# Patient Record
Sex: Female | Born: 1940 | Race: White | Hispanic: No | Marital: Single | Smoking: Never smoker
Health system: Southern US, Community
[De-identification: ages and names within clinical notes are randomized; demographics above are authoritative.]

## PROBLEM LIST (undated history)

## (undated) DIAGNOSIS — K760 Fatty (change of) liver, not elsewhere classified: Secondary | ICD-10-CM

## (undated) DIAGNOSIS — H269 Unspecified cataract: Secondary | ICD-10-CM

## (undated) DIAGNOSIS — E538 Deficiency of other specified B group vitamins: Secondary | ICD-10-CM

## (undated) DIAGNOSIS — I10 Essential (primary) hypertension: Secondary | ICD-10-CM

## (undated) DIAGNOSIS — M1990 Unspecified osteoarthritis, unspecified site: Secondary | ICD-10-CM

## (undated) DIAGNOSIS — K648 Other hemorrhoids: Secondary | ICD-10-CM

## (undated) DIAGNOSIS — K219 Gastro-esophageal reflux disease without esophagitis: Secondary | ICD-10-CM

## (undated) DIAGNOSIS — F1910 Other psychoactive substance abuse, uncomplicated: Secondary | ICD-10-CM

## (undated) DIAGNOSIS — R000 Tachycardia, unspecified: Secondary | ICD-10-CM

## (undated) DIAGNOSIS — D509 Iron deficiency anemia, unspecified: Secondary | ICD-10-CM

## (undated) DIAGNOSIS — R1310 Dysphagia, unspecified: Secondary | ICD-10-CM

## (undated) DIAGNOSIS — K5790 Diverticulosis of intestine, part unspecified, without perforation or abscess without bleeding: Secondary | ICD-10-CM

## (undated) DIAGNOSIS — K635 Polyp of colon: Secondary | ICD-10-CM

## (undated) DIAGNOSIS — E785 Hyperlipidemia, unspecified: Secondary | ICD-10-CM

## (undated) HISTORY — DX: Iron deficiency anemia, unspecified: D50.9

## (undated) HISTORY — DX: Polyp of colon: K63.5

## (undated) HISTORY — DX: Unspecified cataract: H26.9

## (undated) HISTORY — PX: HIATAL HERNIA REPAIR: SHX195

## (undated) HISTORY — DX: Other psychoactive substance abuse, uncomplicated: F19.10

## (undated) HISTORY — DX: Unspecified osteoarthritis, unspecified site: M19.90

## (undated) HISTORY — PX: TONSILLECTOMY AND ADENOIDECTOMY: SUR1326

## (undated) HISTORY — DX: Other hemorrhoids: K64.8

## (undated) HISTORY — DX: Diverticulosis of intestine, part unspecified, without perforation or abscess without bleeding: K57.90

## (undated) HISTORY — DX: Dysphagia, unspecified: R13.10

## (undated) HISTORY — DX: Tachycardia, unspecified: R00.0

## (undated) HISTORY — DX: Fatty (change of) liver, not elsewhere classified: K76.0

## (undated) HISTORY — PX: COLONOSCOPY: SHX174

## (undated) HISTORY — DX: Deficiency of other specified B group vitamins: E53.8

## (undated) HISTORY — DX: Hyperlipidemia, unspecified: E78.5

## (undated) HISTORY — DX: Gastro-esophageal reflux disease without esophagitis: K21.9

## (undated) HISTORY — PX: DILATION AND CURETTAGE OF UTERUS: SHX78

---

## 1999-12-25 ENCOUNTER — Encounter (INDEPENDENT_AMBULATORY_CARE_PROVIDER_SITE_OTHER): Payer: Self-pay | Admitting: Specialist

## 1999-12-25 ENCOUNTER — Other Ambulatory Visit: Admission: RE | Admit: 1999-12-25 | Discharge: 1999-12-25 | Payer: Self-pay | Admitting: Internal Medicine

## 2000-05-24 ENCOUNTER — Emergency Department (HOSPITAL_COMMUNITY): Admission: EM | Admit: 2000-05-24 | Discharge: 2000-05-24 | Payer: Self-pay | Admitting: Emergency Medicine

## 2004-11-06 ENCOUNTER — Ambulatory Visit: Payer: Self-pay | Admitting: Internal Medicine

## 2006-02-23 ENCOUNTER — Ambulatory Visit: Payer: Self-pay | Admitting: Internal Medicine

## 2006-03-09 ENCOUNTER — Encounter (INDEPENDENT_AMBULATORY_CARE_PROVIDER_SITE_OTHER): Payer: Self-pay | Admitting: Specialist

## 2006-03-09 ENCOUNTER — Ambulatory Visit: Payer: Self-pay | Admitting: Internal Medicine

## 2006-04-20 ENCOUNTER — Ambulatory Visit: Payer: Self-pay | Admitting: Internal Medicine

## 2006-05-28 ENCOUNTER — Ambulatory Visit: Payer: Self-pay | Admitting: Internal Medicine

## 2006-06-09 ENCOUNTER — Ambulatory Visit: Payer: Self-pay | Admitting: Internal Medicine

## 2006-06-09 LAB — CONVERTED CEMR LAB
Basophils Absolute: 0.1 10*3/uL (ref 0.0–0.1)
HCT: 31.7 % — ABNORMAL LOW (ref 36.0–46.0)
MCHC: 31.7 g/dL (ref 30.0–36.0)
MCV: 83.9 fL (ref 78.0–100.0)
Neutro Abs: 2.7 10*3/uL (ref 1.4–7.7)
Neutrophils Relative %: 51.9 % (ref 43.0–77.0)
Platelets: 353 10*3/uL (ref 150–400)
RBC: 3.78 M/uL — ABNORMAL LOW (ref 3.87–5.11)

## 2007-04-26 ENCOUNTER — Ambulatory Visit: Payer: Self-pay | Admitting: Internal Medicine

## 2007-04-26 LAB — CONVERTED CEMR LAB
ALT: 20 units/L (ref 0–35)
AST: 22 units/L (ref 0–37)
Basophils Relative: 1.2 % — ABNORMAL HIGH (ref 0.0–1.0)
Bilirubin, Direct: 0.1 mg/dL (ref 0.0–0.3)
CO2: 20 meq/L (ref 19–32)
Calcium: 9.7 mg/dL (ref 8.4–10.5)
Chloride: 108 meq/L (ref 96–112)
Creatinine, Ser: 0.7 mg/dL (ref 0.4–1.2)
Eosinophils Relative: 4.1 % (ref 0.0–5.0)
GFR calc Af Amer: 108 mL/min
Glucose, Bld: 89 mg/dL (ref 70–99)
Lymphocytes Relative: 33 % (ref 12.0–46.0)
Neutro Abs: 2.5 10*3/uL (ref 1.4–7.7)
Platelets: 235 10*3/uL (ref 150–400)
RDW: 12.8 % (ref 11.5–14.6)
Total Bilirubin: 0.6 mg/dL (ref 0.3–1.2)
Total Protein: 7.5 g/dL (ref 6.0–8.3)
WBC: 5.1 10*3/uL (ref 4.5–10.5)

## 2007-05-06 ENCOUNTER — Ambulatory Visit: Payer: Self-pay | Admitting: Internal Medicine

## 2007-05-06 LAB — CONVERTED CEMR LAB
Fecal Occult Blood: NEGATIVE
OCCULT 1: POSITIVE — AB
OCCULT 2: POSITIVE — AB
OCCULT 3: NEGATIVE
OCCULT 4: NEGATIVE
OCCULT 5: NEGATIVE

## 2007-08-15 ENCOUNTER — Ambulatory Visit: Payer: Self-pay | Admitting: Internal Medicine

## 2007-08-15 DIAGNOSIS — K5730 Diverticulosis of large intestine without perforation or abscess without bleeding: Secondary | ICD-10-CM

## 2007-08-15 DIAGNOSIS — K5190 Ulcerative colitis, unspecified, without complications: Secondary | ICD-10-CM | POA: Insufficient documentation

## 2007-08-15 DIAGNOSIS — IMO0002 Reserved for concepts with insufficient information to code with codable children: Secondary | ICD-10-CM

## 2007-08-15 DIAGNOSIS — K219 Gastro-esophageal reflux disease without esophagitis: Secondary | ICD-10-CM | POA: Insufficient documentation

## 2007-08-15 DIAGNOSIS — K648 Other hemorrhoids: Secondary | ICD-10-CM | POA: Insufficient documentation

## 2007-08-15 HISTORY — DX: Gastro-esophageal reflux disease without esophagitis: K21.9

## 2007-08-15 HISTORY — DX: Diverticulosis of large intestine without perforation or abscess without bleeding: K57.30

## 2007-08-15 HISTORY — DX: Reserved for concepts with insufficient information to code with codable children: IMO0002

## 2007-08-15 HISTORY — DX: Ulcerative colitis, unspecified, without complications: K51.90

## 2007-08-15 LAB — CONVERTED CEMR LAB
Bilirubin, Direct: 0.2 mg/dL (ref 0.0–0.3)
Total Protein: 7 g/dL (ref 6.0–8.3)

## 2007-08-17 ENCOUNTER — Ambulatory Visit: Payer: Self-pay | Admitting: Internal Medicine

## 2007-08-19 ENCOUNTER — Encounter: Payer: Self-pay | Admitting: Internal Medicine

## 2007-08-25 DIAGNOSIS — D509 Iron deficiency anemia, unspecified: Secondary | ICD-10-CM

## 2008-05-01 ENCOUNTER — Encounter: Payer: Self-pay | Admitting: Internal Medicine

## 2008-05-11 ENCOUNTER — Ambulatory Visit: Payer: Self-pay | Admitting: Internal Medicine

## 2008-05-11 DIAGNOSIS — K7689 Other specified diseases of liver: Secondary | ICD-10-CM

## 2008-05-11 HISTORY — DX: Other specified diseases of liver: K76.89

## 2008-05-11 LAB — CONVERTED CEMR LAB
ALT: 19 units/L (ref 0–35)
AST: 18 units/L (ref 0–37)
Bilirubin, Direct: 0.1 mg/dL (ref 0.0–0.3)
Eosinophils Relative: 2.8 % (ref 0.0–5.0)
HCT: 37.8 % (ref 36.0–46.0)
Hemoglobin: 12.8 g/dL (ref 12.0–15.0)
Lymphocytes Relative: 24.1 % (ref 12.0–46.0)
Monocytes Absolute: 0.6 10*3/uL (ref 0.1–1.0)
Monocytes Relative: 12 % (ref 3.0–12.0)
Neutro Abs: 3.2 10*3/uL (ref 1.4–7.7)
Total Bilirubin: 0.7 mg/dL (ref 0.3–1.2)
Total Protein: 7.6 g/dL (ref 6.0–8.3)
WBC: 5.1 10*3/uL (ref 4.5–10.5)

## 2009-02-08 ENCOUNTER — Encounter (INDEPENDENT_AMBULATORY_CARE_PROVIDER_SITE_OTHER): Payer: Self-pay | Admitting: *Deleted

## 2009-03-20 ENCOUNTER — Ambulatory Visit: Payer: Self-pay | Admitting: Internal Medicine

## 2009-04-04 ENCOUNTER — Ambulatory Visit: Payer: Self-pay | Admitting: Internal Medicine

## 2009-04-04 ENCOUNTER — Encounter: Payer: Self-pay | Admitting: Internal Medicine

## 2009-04-05 ENCOUNTER — Encounter: Payer: Self-pay | Admitting: Internal Medicine

## 2009-04-12 ENCOUNTER — Ambulatory Visit: Payer: Self-pay | Admitting: Internal Medicine

## 2009-04-12 LAB — CONVERTED CEMR LAB
Basophils Absolute: 0 10*3/uL (ref 0.0–0.1)
Basophils Relative: 0 % (ref 0.0–3.0)
Eosinophils Absolute: 0.3 10*3/uL (ref 0.0–0.7)
Lymphocytes Relative: 28.1 % (ref 12.0–46.0)
MCHC: 33.8 g/dL (ref 30.0–36.0)
MCV: 102.8 fL — ABNORMAL HIGH (ref 78.0–100.0)
Monocytes Absolute: 0.4 10*3/uL (ref 0.1–1.0)
Neutrophils Relative %: 57.8 % (ref 43.0–77.0)
Platelets: 210 10*3/uL (ref 150.0–400.0)
RBC: 3.66 M/uL — ABNORMAL LOW (ref 3.87–5.11)

## 2009-04-15 ENCOUNTER — Ambulatory Visit: Payer: Self-pay | Admitting: Internal Medicine

## 2009-04-16 LAB — CONVERTED CEMR LAB
Folate: 17.1 ng/mL
Vitamin B-12: 296 pg/mL (ref 211–911)

## 2009-04-18 ENCOUNTER — Ambulatory Visit: Payer: Self-pay | Admitting: Internal Medicine

## 2009-04-24 ENCOUNTER — Ambulatory Visit: Payer: Self-pay | Admitting: Internal Medicine

## 2009-04-24 DIAGNOSIS — E538 Deficiency of other specified B group vitamins: Secondary | ICD-10-CM | POA: Insufficient documentation

## 2009-05-01 ENCOUNTER — Ambulatory Visit: Payer: Self-pay | Admitting: Internal Medicine

## 2009-05-08 ENCOUNTER — Ambulatory Visit: Payer: Self-pay | Admitting: Internal Medicine

## 2009-06-07 ENCOUNTER — Ambulatory Visit: Payer: Self-pay | Admitting: Internal Medicine

## 2009-07-12 ENCOUNTER — Ambulatory Visit: Payer: Self-pay | Admitting: Internal Medicine

## 2009-07-15 LAB — CONVERTED CEMR LAB
Eosinophils Relative: 5.1 % — ABNORMAL HIGH (ref 0.0–5.0)
Lymphocytes Relative: 25.7 % (ref 12.0–46.0)
Monocytes Relative: 9.3 % (ref 3.0–12.0)
Neutrophils Relative %: 57.4 % (ref 43.0–77.0)
Platelets: 393 10*3/uL (ref 150.0–400.0)
RDW: 14.6 % (ref 11.5–14.6)
WBC: 4.3 10*3/uL — ABNORMAL LOW (ref 4.5–10.5)

## 2009-08-15 ENCOUNTER — Ambulatory Visit: Payer: Self-pay | Admitting: Internal Medicine

## 2009-09-16 ENCOUNTER — Ambulatory Visit: Payer: Self-pay | Admitting: Internal Medicine

## 2009-10-14 ENCOUNTER — Ambulatory Visit: Payer: Self-pay | Admitting: Internal Medicine

## 2009-10-14 LAB — CONVERTED CEMR LAB
Basophils Absolute: 0.1 10*3/uL (ref 0.0–0.1)
Basophils Relative: 1.4 % (ref 0.0–3.0)
Eosinophils Absolute: 0.2 10*3/uL (ref 0.0–0.7)
Folate: 15.2 ng/mL
Lymphocytes Relative: 26.8 % (ref 12.0–46.0)
MCHC: 33.3 g/dL (ref 30.0–36.0)
MCV: 100 fL (ref 78.0–100.0)
Monocytes Absolute: 0.6 10*3/uL (ref 0.1–1.0)
Neutrophils Relative %: 56 % (ref 43.0–77.0)
Platelets: 183 10*3/uL (ref 150.0–400.0)
RBC: 3.48 M/uL — ABNORMAL LOW (ref 3.87–5.11)
RDW: 15.1 % — ABNORMAL HIGH (ref 11.5–14.6)

## 2009-11-13 ENCOUNTER — Ambulatory Visit: Payer: Self-pay | Admitting: Internal Medicine

## 2009-12-11 ENCOUNTER — Ambulatory Visit: Payer: Self-pay | Admitting: Internal Medicine

## 2010-01-10 ENCOUNTER — Ambulatory Visit: Payer: Self-pay | Admitting: Internal Medicine

## 2010-01-10 LAB — CONVERTED CEMR LAB
Basophils Absolute: 0 10*3/uL
Basophils Relative: 0.8 %
Eosinophils Absolute: 0.2 10*3/uL
Eosinophils Relative: 3.3 %
HCT: 36.9 %
Hemoglobin: 12.4 g/dL
Lymphocytes Relative: 26.3 %
Lymphs Abs: 1.4 10*3/uL
MCHC: 33.5 g/dL
MCV: 101.1 fL — ABNORMAL HIGH
Monocytes Absolute: 0.6 10*3/uL
Monocytes Relative: 11.1 %
Neutro Abs: 3.1 10*3/uL
Neutrophils Relative %: 58.5 %
Platelets: 204 10*3/uL
RBC: 3.65 M/uL — ABNORMAL LOW
RDW: 14.2 %
WBC: 5.4 10*3/uL

## 2010-02-10 ENCOUNTER — Ambulatory Visit: Payer: Self-pay | Admitting: Internal Medicine

## 2010-02-25 ENCOUNTER — Ambulatory Visit: Payer: Self-pay | Admitting: Internal Medicine

## 2010-03-14 ENCOUNTER — Ambulatory Visit: Payer: Self-pay | Admitting: Internal Medicine

## 2010-04-14 ENCOUNTER — Ambulatory Visit: Payer: Self-pay | Admitting: Internal Medicine

## 2010-05-12 ENCOUNTER — Ambulatory Visit: Payer: Self-pay | Admitting: Internal Medicine

## 2010-06-09 ENCOUNTER — Ambulatory Visit: Payer: Self-pay | Admitting: Internal Medicine

## 2010-07-10 ENCOUNTER — Ambulatory Visit
Admission: RE | Admit: 2010-07-10 | Discharge: 2010-07-10 | Payer: Self-pay | Source: Home / Self Care | Attending: Internal Medicine | Admitting: Internal Medicine

## 2010-07-22 NOTE — Assessment & Plan Note (Signed)
Summary: Monthly B12, 266.2  Nurse Visit   Allergies: 1)  ! Codeine  Medication Administration  Injection # 1:    Medication: Vit B12 1000 mcg    Diagnosis: B12 DEFICIENCY (ICD-266.2)    Route: IM    Site: L deltoid    Exp Date: 01/2012    Lot #: [id number]    Mfr: [place]    Comments: pt scheduled the next monthly b 12 at the front desk    Patient tolerated injection without complications    Given by: Christian Mate CMA Deborra Medina) (May 12, 2010 2:38 PM)  Orders Added: 1)  Vit B12 1000 mcg [Y3016]

## 2010-07-22 NOTE — Assessment & Plan Note (Signed)
Summary: RENEW AZULFIDINE RX...AS.    History of Present Illness Visit Type: Follow-up Visit Primary GI MD: Delfin Edis MD Primary Provider: Gilford Rile, MD Requesting Provider: na Chief Complaint: F/u for ulcerative colitis. Pt states that she feels great and denies any GI complaints. Pt needs Azulfidine refilled  History of Present Illness:   This is a 70 year old white female with ulcerative colitis since 27. She used to have flare ups almost yearly but has always responded to steroids. She started on 6-MP in 2007. She has not had any flareups for several years. Her last colonoscopy in October 2010 showed a hyperplastic sigmoid polyp but there was otherwise no evidence of acute colitis. Her last hemoglobin completed last in July 2011 was 12.6. She is on B12 supplements 1,000 mcg monthly. Her initial B12 level was 295. Her most recent  B12 level was normal. She is here today to refill her medications.   GI Review of Systems      Denies abdominal pain, acid reflux, belching, bloating, chest pain, dysphagia with liquids, dysphagia with solids, heartburn, loss of appetite, nausea, vomiting, vomiting blood, weight loss, and  weight gain.        Denies anal fissure, black tarry stools, change in bowel habit, constipation, diarrhea, diverticulosis, fecal incontinence, heme positive stool, hemorrhoids, irritable bowel syndrome, jaundice, light color stool, liver problems, rectal bleeding, and  rectal pain.    Current Medications (verified): 1)  Mercaptopurine 50 Mg Tabs (Mercaptopurine) .... Take 1/2 Tablet  (52m) By Mouth Once A Day 2)  Azulfidine 500 Mg Tabs (Sulfasalazine) .... Take 2 Tablets By Mouth Two Times A Day 3)  Folic Acid 1 Mg Tabs (Folic Acid) .... Take 1 Tablet By Mouth Once A Day 4)  Slow Release Iron 160 (50 Fe) Mg Cr-Tabs (Ferrous Sulfate Dried) .... Take 1 Tablet By Mouth 2-3 Times Per Week 5)  Reclasp Infusion .... Take Once Yearly 6)  Prevacid 24hr 15 Mg Cpdr  (Lansoprazole) .... As Needed 7)  Simvastatin 20 Mg Tabs (Simvastatin) .... One Tablet By Mouth Once Daily  Allergies (verified): 1)  ! Codeine  Past History:  Past Medical History: Reviewed history from 05/10/2008 and no changes required. Current Problems:  IRON DEFICIENCY (ICD-280.9) AGITATION (ICD-307.9) DIVERTICULOSIS OF COLON (ICD-562.10) INTERNAL HEMORRHOIDS (ICD-455.0) GERD (ICD-530.81) ULCERATIVE COLITIS (ICD-556.9)  Past Surgical History: Reviewed history from 05/11/2008 and no changes required. T & A  D&C  Family History: Reviewed history from 06/07/2009 and no changes required. Lymphoma: Brother Family History of Diabetes: Mother Family History of Heart Disease: Father Family History of Liver Disease/Cirrhosis:Niece No FH of Colon Cancer:  Social History: Reviewed history from 06/07/2009 and no changes required. Occupation: Retired Patient has never smoked.  Alcohol Use - yes -occ Illicit Drug Use - no Single no children  Review of Systems  The patient denies allergy/sinus, anemia, anxiety-new, arthritis/joint pain, back pain, blood in urine, breast changes/lumps, change in vision, confusion, cough, coughing up blood, depression-new, fainting, fatigue, fever, headaches-new, hearing problems, heart murmur, heart rhythm changes, itching, menstrual pain, muscle pains/cramps, night sweats, nosebleeds, pregnancy symptoms, shortness of breath, skin rash, sleeping problems, sore throat, swelling of feet/legs, swollen lymph glands, thirst - excessive , urination - excessive , urination changes/pain, urine leakage, vision changes, and voice change.         Pertinent positive and negative review of systems were noted in the above HPI. All other ROS was otherwise negative.   Vital Signs:  Patient profile:   70year old female  Height:      66 inches Weight:      188 pounds BMI:     30.45 BSA:     1.95 Pulse rate:   88 / minute Pulse rhythm:   regular BP sitting:    132 / 80  (left arm) Cuff size:   regular  Vitals Entered By: Hope Pigeon CMA (February 25, 2010 10:27 AM)   Impression & Recommendations:  Problem # 1:  B12 DEFICIENCY (ICD-266.2) Patient is on B12 1000 ug monthly which she gets in our office. We will recheck her B12 level in 6 months.  Problem # 2:  IRON DEFICIENCY (ICD-280.9) Patient's last hemoglobin was normal.  Problem # 3:  ULCERATIVE COLITIS (ICD-556.9) Patient has had Ulcerative Colitis since 1981. We will refill her sulfasalazine 1 g twice a day, folic acid 1 mg p.o. q.d. and 6-MP 25 mg daily.  Patient Instructions: 1)  CBC, B12 every 6 months. Next labs will be March 2012. 2)  Refill folic acid 6-MP and azulfadine. 3)  Recall colonoscopy October 2013. 4)  Office visit in 1 year. 5)  Copy sent to : Dr Bea Graff 6)  The medication list was reviewed and reconciled.  All changed / newly prescribed medications were explained.  A complete medication list was provided to the patient / caregiver. Prescriptions: FOLIC ACID 1 MG TABS (FOLIC ACID) Take 1 tablet by mouth once a day  #90 x 5   Entered by:   Madlyn Frankel CMA (AAMA)   Authorized by:   Lafayette Dragon MD   Signed by:   Madlyn Frankel CMA ([place]) on 02/25/2010   Method used:   Electronically to        [facility]  [address] (retail)       [address], [place]  [address]       Ph: [phone] or [phone]       Fax: [phone]   RxID:   [id number] MERCAPTOPURINE 50 MG TABS (MERCAPTOPURINE) Take as directed  #30 x 6   Entered by:   Madlyn Frankel CMA ([place])   Authorized by:   Lafayette Dragon MD   Signed by:   Madlyn Frankel CMA ([place]) on 02/25/2010   Method used:   Electronically to        [facility]  [address] (retail)       [address], [place]  [address]       Ph: [phone] or [phone]       Fax: [phone]   RxID:   [id number] AZULFIDINE 500 MG TABS  (SULFASALAZINE) Take 2 tablets by mouth two times a day  #120 x 11   Entered by:   Madlyn Frankel CMA (AAMA)   Authorized by:   Lafayette Dragon MD   Signed by:   Madlyn Frankel CMA ([place]) on 02/25/2010   Method used:   Electronically to        [facility]  [address] (retail)       [address], [place]  [address]       Ph: [phone] or [phone]       Fax: [phone]   RxID:   [id number]

## 2010-07-22 NOTE — Assessment & Plan Note (Signed)
Summary: MONTHLY B12 SHOT...LSW.  Nurse Visit   Allergies: 1)  ! Codeine  Medication Administration  Injection # 1:    Medication: Vit B12 1000 mcg    Diagnosis: B12 DEFICIENCY (ICD-266.2)    Route: IM    Site: L deltoid    Exp Date: 11/2011    Lot #: [id number]    Mfr: [employer]    Comments: Monthly B12 injection    Patient tolerated injection without complications    Given by: Hope Pigeon CMA (February 10, 2010 10:22 AM)  Orders Added: 1)  Vit B12 1000 mcg [[id number]]

## 2010-07-22 NOTE — Assessment & Plan Note (Signed)
Summary: B12 SHOT..AM.  Nurse Visit   Allergies: 1)  ! Codeine  Medication Administration  Injection # 1:    Medication: Vit B12 1000 mcg    Diagnosis: B12 DEFICIENCY (ICD-266.2)    Route: IM    Site: R deltoid    Exp Date: 01/2011    Lot #: [id number]    Mfr: [employer]    Comments: pt to schedule next monthly b12 at front desk    Patient tolerated injection without complications    Given by: Christian Mate CMA ([place]) (August 15, 2009 10:10 AM)  Orders Added: 1)  Vit B12 1000 mcg [[id number]]

## 2010-07-22 NOTE — Assessment & Plan Note (Signed)
Summary: MONTHLY B12 SHOT...LSW.  Nurse Visit   Allergies: 1)  ! Codeine  Medication Administration  Injection # 1:    Medication: Vit B12 1000 mcg    Diagnosis: B12 DEFICIENCY (ICD-266.2)    Route: IM    Site: R deltoid    Exp Date: 12/21/2011    Lot #: [id number]    Mfr: [employer]    Comments: Made next monthly B12 injection appt on 05-12-10 at 3:00 PM .    Patient tolerated injection without complications    Given by: Marisue Humble NCMA (April 14, 2010 10:53 AM)  Orders Added: 1)  Vit B12 1000 mcg [[id number]]

## 2010-07-22 NOTE — Assessment & Plan Note (Signed)
Summary: b12 shot  Nurse Visit   Allergies: 1)  ! Codeine  Medication Administration  Injection # 1:    Medication: Vit B12 1000 mcg    Diagnosis: B12 DEFICIENCY (ICD-266.2)    Route: IM    Site: L deltoid    Exp Date: 09/21/2011    Lot #: [id number]    Mfr: [employer]    Comments: Next Monthly B12 appt made for 12-11-09 at 11AM.    Patient tolerated injection without complications    Given by: Marisue Humble NCMA (Nov 13, 2009 2:51 PM)

## 2010-07-22 NOTE — Assessment & Plan Note (Signed)
Summary: MONTHLY B12 SHOT...LSW.  Nurse Visit   Allergies: 1)  ! Codeine  Medication Administration  Injection # 1:    Medication: Vit B12 1000 mcg    Diagnosis: B12 DEFICIENCY (ICD-266.2)    Route: IM    Site: L deltoid    Exp Date: 10/2011    Lot #: [id number]    Mfr: [place]    Patient tolerated injection without complications    Given by: Madlyn Frankel CMA ([place]) (January 10, 2010 10:38 AM)  Orders Added: 1)  Vit B12 1000 mcg [[id number]]   Medication Administration  Injection # 1:    Medication: Vit B12 1000 mcg    Diagnosis: B12 DEFICIENCY (ICD-266.2)    Route: IM    Site: L deltoid    Exp Date: 10/2011    Lot #: [id number]    Mfr: [place]    Patient tolerated injection without complications    Given by: Madlyn Frankel CMA ([place]) (January 10, 2010 10:38 AM)  Orders Added: 1)  Vit B12 1000 mcg [[id number]]

## 2010-07-22 NOTE — Assessment & Plan Note (Signed)
Summary: MONTHLY B12 SHOT..AM.  Nurse Visit   Allergies: 1)  ! Codeine  Medication Administration  Injection # 1:    Medication: Vit B12 1000 mcg    Diagnosis: B12 DEFICIENCY (ICD-266.2)    Route: IM    Site: L deltoid    Exp Date: 12/2011    Lot #: [id number]    Mfr: [place]    Patient tolerated injection without complications    Given by: Bernita Buffy CMA ([place]) (March 14, 2010 1:23 PM)  Orders Added: 1)  Vit B12 1000 mcg [[id number]]

## 2010-07-22 NOTE — Assessment & Plan Note (Signed)
Summary: monthly b12/dn  Nurse Visit   Allergies: 1)  ! Codeine  Medication Administration  Injection # 1:    Medication: Vit B12 1000 mcg    Diagnosis: B12 DEFICIENCY (ICD-266.2)    Route: IM    Site: L deltoid    Exp Date: 10/12    Lot #: [id number]    Mfr: [employer]    Patient tolerated injection without complications    Given by: Awilda Bill CMA Deborra Medina) (July 12, 2009 10:03 AM)  Orders Added: 1)  Vit B12 1000 mcg [[id number]]

## 2010-07-22 NOTE — Assessment & Plan Note (Signed)
Summary: MONTHLY B12 SHOT...LSW.  Nurse Visit   Allergies: 1)  ! Codeine  Medication Administration  Injection # 1:    Medication: Vit B12 1000 mcg    Diagnosis: B12 DEFICIENCY (ICD-266.2)    Route: IM    Site: L deltoid    Exp Date: 07/24/2011    Lot #: [id number]    Mfr: [employer]    Patient tolerated injection without complications    Given by: Marlon Pel CMA ([place]) (October 14, 2009 10:30 AM)  Orders Added: 1)  Vit B12 1000 mcg [[id number]]

## 2010-07-22 NOTE — Assessment & Plan Note (Signed)
Summary: B12 Injection, 266.2, monthly  Nurse Visit   Allergies: 1)  ! Codeine  Medication Administration  Injection # 1:    Medication: Vit B12 1000 mcg    Diagnosis: B12 DEFICIENCY (ICD-266.2)    Route: IM    Site: L deltoid    Exp Date: 07/24/2011    Lot #: [id number]    Mfr: [employer]    Comments: Monthly injection to be scheduled with labwork, same day    Patient tolerated injection without complications    Given by: June McMurray [place] Deborra Medina) (December 11, 2009 10:47 AM)   Medication Administration  Injection # 1:    Medication: Vit B12 1000 mcg    Diagnosis: B12 DEFICIENCY (ICD-266.2)    Route: IM    Site: L deltoid    Exp Date: 07/24/2011    Lot #: [id number]    Mfr: [employer]    Comments: Monthly injection to be scheduled with labwork, same day    Patient tolerated injection without complications    Given by: June McMurray [place] Deborra Medina) (December 11, 2009 10:47 AM)

## 2010-07-22 NOTE — Assessment & Plan Note (Signed)
Summary: MONTHLY B12 SHOT..AM.  Nurse Visit   Allergies: 1)  ! Codeine  Medication Administration  Injection # 1:    Medication: Vit B12 1000 mcg    Diagnosis: B12 DEFICIENCY (ICD-266.2)    Route: IM    Site: L deltoid    Exp Date: 12/12    Lot #: [id number]    Mfr: [employer]    Patient tolerated injection without complications    Given by: Awilda Bill CMA Deborra Medina) (September 16, 2009 1:02 PM)  Orders Added: 1)  Vit B12 1000 mcg [[id number]]

## 2010-07-24 NOTE — Assessment & Plan Note (Signed)
Summary: MONTHLY B12 SHOT/JMS  Nurse Visit   Allergies: 1)  ! Codeine  Medication Administration  Injection # 1:    Medication: Vit B12 1000 mcg    Diagnosis: B12 DEFICIENCY (ICD-266.2)    Route: IM    Site: L deltoid    Exp Date: 03/22/2012    Lot #: [id number]    Mfr: [employer]    Comments: Monthly B12 Injection     Patient tolerated injection without complications    Given by: Hope Pigeon CMA (June 09, 2010 1:26 PM)  Orders Added: 1)  Vit B12 1000 mcg [[id number]]

## 2010-07-24 NOTE — Assessment & Plan Note (Signed)
Summary: MONTHLY B12  Nurse Visit   Allergies: 1)  ! Codeine  Medication Administration  Injection # 1:    Medication: Vit B12 1000 mcg    Diagnosis: B12 DEFICIENCY (ICD-266.2)    Route: IM    Site: R deltoid    Exp Date: 04/22/2012    Lot #: [id number]    Mfr: [employer]    Comments: Monthly B12 injections Patient inquired about next appointment for [facility], which is 09/08/2010, patient informed    Patient tolerated injection without complications    Given by: June McMurray [place] Deborra Medina) (July 10, 2010 11:02 AM)  Orders Added: 1)  Vit B12 1000 mcg [[id number]]   Medication Administration  Injection # 1:    Medication: Vit B12 1000 mcg    Diagnosis: B12 DEFICIENCY (ICD-266.2)    Route: IM    Site: R deltoid    Exp Date: 04/22/2012    Lot #: [id number]    Mfr: [employer]    Comments: Monthly B12 injections Patient inquired about next appointment for [facility], which is 09/08/2010, patient informed    Patient tolerated injection without complications    Given by: June McMurray [place] Deborra Medina) (July 10, 2010 11:02 AM)  Orders Added: 1)  Vit B12 1000 mcg [[id number]]

## 2010-08-11 ENCOUNTER — Encounter (INDEPENDENT_AMBULATORY_CARE_PROVIDER_SITE_OTHER): Payer: Self-pay | Admitting: *Deleted

## 2010-08-11 ENCOUNTER — Encounter (INDEPENDENT_AMBULATORY_CARE_PROVIDER_SITE_OTHER): Payer: MEDICARE

## 2010-08-11 DIAGNOSIS — E538 Deficiency of other specified B group vitamins: Secondary | ICD-10-CM

## 2010-08-19 NOTE — Assessment & Plan Note (Signed)
Summary: MONTHLY B12 SHOT  Nurse Visit   Allergies: 1)  ! Codeine  Medication Administration  Injection # 3:    Medication: Vit B12 1000 mcg    Diagnosis: B12 DEFICIENCY (ICD-266.2)    Route: IM    Site: L deltoid    Exp Date: 04/22/2012    Lot #: [id number]    Mfr: [employer]    Comments: patient tolerated well- next, 6 month B12 lab     Patient tolerated injection without complications    Given by: Shella Maxim RN (August 11, 2010 1:14 PM)  Orders Added: 1)  Vit B12 1000 mcg [[id number]]

## 2010-09-08 ENCOUNTER — Other Ambulatory Visit: Payer: Self-pay

## 2010-09-11 ENCOUNTER — Ambulatory Visit: Payer: MEDICARE

## 2010-09-11 ENCOUNTER — Ambulatory Visit (INDEPENDENT_AMBULATORY_CARE_PROVIDER_SITE_OTHER): Payer: MEDICARE | Admitting: Internal Medicine

## 2010-09-11 DIAGNOSIS — E538 Deficiency of other specified B group vitamins: Secondary | ICD-10-CM

## 2010-09-11 DIAGNOSIS — K5190 Ulcerative colitis, unspecified, without complications: Secondary | ICD-10-CM

## 2010-09-11 LAB — CBC WITH DIFFERENTIAL/PLATELET
Basophils Relative: 0.7 % (ref 0.0–3.0)
Eosinophils Absolute: 0.2 10*3/uL (ref 0.0–0.7)
Eosinophils Relative: 3.2 % (ref 0.0–5.0)
HCT: 38.1 % (ref 36.0–46.0)
Lymphs Abs: 1.1 10*3/uL (ref 0.7–4.0)
MCHC: 33.4 g/dL (ref 30.0–36.0)
MCV: 100.8 fl — ABNORMAL HIGH (ref 78.0–100.0)
Monocytes Absolute: 0.5 10*3/uL (ref 0.1–1.0)
Neutro Abs: 3 10*3/uL (ref 1.4–7.7)
RBC: 3.79 Mil/uL — ABNORMAL LOW (ref 3.87–5.11)
WBC: 4.8 10*3/uL (ref 4.5–10.5)

## 2010-09-11 MED ORDER — CYANOCOBALAMIN 1000 MCG/ML IJ SOLN
1000.0000 ug | INTRAMUSCULAR | Status: AC
  Administered 2010-09-11 – 2011-01-23 (×5): 1000 ug via INTRAMUSCULAR

## 2010-09-12 ENCOUNTER — Telehealth: Payer: Self-pay | Admitting: *Deleted

## 2010-09-12 NOTE — Telephone Encounter (Signed)
Message copied by Leone Payor on Fri Sep 12, 2010  8:46 AM ------      Message from: Delfin Edis      Created: Thu Sep 11, 2010  5:02 PM       ERROR! The text below belongs to another patient.            ----- Message -----         From: Lafayette Dragon, MD         Sent: 09/11/2010   4:47 PM           To: Hulan Saas, RN            I have discussed results of the CT scan with the pt.he is still nauseated, unable to eat. Please send her Augmentin 8100m, #7, 1 po qd. Also send Lab order to the Lab for tomorrow am labs: U/A, CBC . Thanxwith diff., amylase,lipase, sed.rate

## 2010-09-12 NOTE — Telephone Encounter (Signed)
Left message for patient to call back. Leone Payor, RN Level II

## 2010-09-12 NOTE — Telephone Encounter (Signed)
Patient returned our call and left message. Called patient and left message with results. Leone Payor, RN Level II

## 2010-10-16 ENCOUNTER — Ambulatory Visit (INDEPENDENT_AMBULATORY_CARE_PROVIDER_SITE_OTHER): Payer: MEDICARE | Admitting: Internal Medicine

## 2010-10-16 DIAGNOSIS — E538 Deficiency of other specified B group vitamins: Secondary | ICD-10-CM

## 2010-11-04 NOTE — Assessment & Plan Note (Signed)
[facility]                         GASTROENTEROLOGY OFFICE NOTE   NAME:Caroline Dean, Caroline Dean                    MRN:          [id number]  DATE:08/17/2007                            DOB:          16-Nov-1940    Caroline Dean is a 70 year old white female with chronic ulcerative colitis  since 1981, last colonoscopy in September 2007, with active left-sided  colitis, no evidence of dysplasia.  She was recently changed to 6-  mercaptopurine 50 mg daily and remained asymptomatic until about a month  ago, when she developed occasional right upper quadrant discomfort,  which travels across her abdomen to the left lower quadrant and feels  like gas.  She denies any nausea or vomiting and there is no family  history of gallbladder disease.  She has never had an ultrasound of the  abdomen.  Her liver function tests show mild elevation of alkaline  phosphatase in November 2008 and again on August 15, 2007, showing  normal serum albumin and transaminases.   MEDICATIONS:  1. Azulfidine 500 mg two p.o. b.i.d.  2. Folic acid 1 mg p.o. daily.  3. Simvastatin 20 mg p.o. daily.  4. Vitamin D with calcium daily.  5. Omeprazole 20 mg p.o. daily.  6. Slow release iron 2 times a week.  7. 6-mercaptopurine 50 mg p.o. daily.   PHYSICAL EXAM:  Blood pressure 142/84, pulse 72, weight 190 pounds.  She was alert and oriented, in no distress.  LUNGS:  Clear to auscultation.  COR:  With normal S1, normal S2.  ABDOMEN:  Soft, nontender with normoactive bowel sounds, mildly obese.  Right upper quadrant was normal with liver edge at costal margin.  No  pain on inspiration.  Lower abdomen was normal.  RECTAL EXAM:  With soft Hemoccult-negative stool.   IMPRESSION:  1. Patient is a 70 year old white female with chronic ulcerative      colitis and new onset of right upper quadrant discomfort, which      could be possibly due to colon spasm.  Rule out symptomatic      gallbladder  disease.  Rule out fatty liver.  2. Abnormal alkaline phosphatase.  Rule out fatty liver.  Rule out      gallbladder disease.  Rule out drug-related abnormalities, due to      6MP.   PLAN:  1. Upper abdominal ultrasound with attention to liver, pancreas and      gallbladder.  2. Sample soft probiotic Align one p.o. daily.  3. Bentyl 10 mg up to two a day p.r.n. crampy abdominal pain.   I feel that her symptoms are not suggestive of flare-up of her  ulcerative colitis, but she will let us know if the symptoms  deteriorate, for instance if she develops diarrhea or rectal bleeding.     Lowella Bandy. [name], MD  Electronically Signed    DMB/MedQ  DD: 08/17/2007  DT: 08/17/2007  Job #: [id number]   cc:   Gilford Rile, MD

## 2010-11-04 NOTE — Assessment & Plan Note (Signed)
[facility] OFFICE NOTE   NAME:Caroline Dean, Caroline Dean                    MRN:          [id number]  DATE:04/26/2007                            DOB:          1940-07-25    The patient is a 70 year old white female with ulcerative colitis since  1981, periodic colonoscopy last one in September of 2007 which did not  show any evidence of dysplasia or any activity.  She has iron deficiency  anemia.  Since her last flare-up recently she has been on 6-  mercaptopuren 25 mg daily and has been asymptomatic.  She has been on  chronic iron supplements because of iron deficiency anemia.  Her last  hemoglobin in last December was 7.5 and hematocrit 31.7.   MEDICATIONS:  1. Folic acid 1 mg p.o. daily.  2. Azulfidine 1 gram p.o. b.i.d.  3. Omeprazole 20 mg p.o. daily.  4. Slow release iron two to three times a week.  5. Lipitor 20 mg p.o. daily.   PHYSICAL EXAMINATION:  VITAL SIGNS:  Blood pressure 138/88, pulse 80,  and weight 193 pounds.  GENERAL:  She is alert and oriented and in no distress.  LUNGS:  Clear to auscultation.  HEART:  Normal S1 and S2.  ABDOMEN:  Soft and nontender with normal active bowel sounds.  RECTAL:  Black hemoccult positive stool.   IMPRESSION:  A 70 year old white female with ulcerative colitis of at  least 25 years' duration, normal colonoscopy in September of 2007.  She  is not experiencing symptoms of ulcerative colitis, but she is heme  positive.  This could be related either to low activity of her disease  or possibly just some anorectal source.  It would not be likely from  upper GI tract, although, she does take an occasional omeprazole for  gastroesophageal reflux disease.   PLAN:  1. Refill on all of her medications.  2. CBC and iron levels today, sed rate, and CMET as well as CEA level.  3. Repeat hemoccults today.  If positive, I would probably consider      repeating the  colonoscopy which has normally been scheduled for      September 2010.     Lowella Bandy. [name], MD  Electronically Signed   DMB/MedQ  DD: 04/26/2007  DT: 04/26/2007  Job #: [id number]   cc:   Gilford Rile, MD

## 2010-11-07 NOTE — Assessment & Plan Note (Signed)
[facility] OFFICE NOTE   NAME:Caroline Dean, Caroline Dean                    MRN:          [id number]  DATE:05/28/2006                            DOB:          [birth year]    Caroline Dean is a delightful 70 year old white female with chronic  ulcerative colitis since 31.  She had a colonoscopy in September 2007  which showed no evidence of dysplasia.  In recent years she has had  recent flare-ups of her colitis, and so we put her on 6-mercaptopurine  on her last visit 8 weeks ago.  Unfortunately, she developed nausea  after being on the medication for several weeks, which went away after  stopping the medication 3 days ago.  Her ulcerative colitis has been in  remission.  She was able to taper off her prednisone to 2.5 mg daily,  and finally discontinued it about 2 weeks ago.  She is having regular  bowel habits once a day, no bleeding, no diarrhea or abdominal pain.   MEDICATIONS:  Other medications include:  1. Azulfidine 1 gram twice a day.  2. Folic acid 1 mg once a day.  3. Vitamin D and calcium.  4. She is also on OTC Prilosec 200 mg daily.   PHYSICAL EXAMINATION:  Blood pressure 140/70, pulse 68, weight 194  pounds.  She was alert, oriented, in no distress.  LUNGS:  Clear to auscultation.  COR:  With normal S1, normal S2.  ABDOMEN:  Soft, nontender with normal active bowel sounds.  RECTAL:  Exam not repeated today.   IMPRESSION:  1. 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  2. INTOLERANCE TO 6-MERCAPTOPURINE, had to be discontinued.   PLAN:  1. Decrease 6-MP to 25 mg a day.  I would like her to continue it in      the lower dose because she has been having frequent flare-ups, and      the 6-MP may prevent it and avoid use of steroids, which she has      used over the years and had resulted in osteoporosis.  2. Continue Azulfidine as such.  3. CBC with diff today.  4. Office visit in 6 months.     Lowella Bandy. [name], MD  Electronically Signed   DMB/MedQ  DD: 05/28/2006  DT: 05/29/2006  Job #: [id number]   cc:   Gilford Rile, MD

## 2010-11-07 NOTE — Assessment & Plan Note (Signed)
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  She just had a followup colonoscopy on  March 09, 2006, with finding of active colitis in the segment of  descending colon between 30 and 70 cm.  This was confirmed on biopsies which  showed active chronic mucosal colitis consistent with ulcerative colitis  without evidence of dysplasia.  She also has hemorrhoids and mild  diverticulosis of the left colon.  As a result of the biopsies and  endoscopic findings, she was put on a tapering dose of prednisone with  marked improvement of her bowel habits.  She has been able to decrease her  dose to 5 mg a day starting today and planning to be on 5 mg for two weeks,  then 2.5 mg for two weeks, then discontinue.  I have discussed her flare-ups  of ulcerative colitis with Ms. Caroline Dean.  She has had flare-up about every  year necessitating use of steroids.  Because of her high risk for  osteoporosis and many use of steroids, I suggested that we start her on  immunomodulator 6-mercaptopurine 50 mg a day to prevent flare-ups of her  colitis.  She would still continue her Azulfidine 2 g a day with folic acid  1 mg a day.  I have explained to her side effects and possible risks of  using 6-mercaptopurine, but I think the benefits would at this time exceed  the risks.   PLAN:  Add 6-mercaptopurine 50 mg daily while tapering off of her  prednisone.  I will see her again in 6 weeks.  At that time we will check  her CBC.     Lowella Bandy. [name], MD    DMB/MedQ  DD: 04/20/2006  DT: 04/20/2006  Job #: [id number]   cc:   Gilford Rile, MD

## 2010-11-18 ENCOUNTER — Ambulatory Visit (INDEPENDENT_AMBULATORY_CARE_PROVIDER_SITE_OTHER): Payer: Medicare Other | Admitting: Internal Medicine

## 2010-11-18 DIAGNOSIS — E538 Deficiency of other specified B group vitamins: Secondary | ICD-10-CM

## 2010-12-22 ENCOUNTER — Ambulatory Visit (INDEPENDENT_AMBULATORY_CARE_PROVIDER_SITE_OTHER): Payer: Medicare Other | Admitting: Internal Medicine

## 2010-12-22 DIAGNOSIS — E538 Deficiency of other specified B group vitamins: Secondary | ICD-10-CM

## 2011-01-23 ENCOUNTER — Ambulatory Visit (INDEPENDENT_AMBULATORY_CARE_PROVIDER_SITE_OTHER): Payer: Medicare Other | Admitting: Gastroenterology

## 2011-01-23 DIAGNOSIS — E538 Deficiency of other specified B group vitamins: Secondary | ICD-10-CM

## 2011-02-27 ENCOUNTER — Ambulatory Visit (INDEPENDENT_AMBULATORY_CARE_PROVIDER_SITE_OTHER): Payer: Medicare Other | Admitting: Internal Medicine

## 2011-02-27 DIAGNOSIS — E538 Deficiency of other specified B group vitamins: Secondary | ICD-10-CM

## 2011-03-20 ENCOUNTER — Other Ambulatory Visit: Payer: Self-pay | Admitting: Internal Medicine

## 2011-03-31 ENCOUNTER — Ambulatory Visit (INDEPENDENT_AMBULATORY_CARE_PROVIDER_SITE_OTHER): Payer: Medicare Other | Admitting: Internal Medicine

## 2011-03-31 DIAGNOSIS — E538 Deficiency of other specified B group vitamins: Secondary | ICD-10-CM

## 2011-03-31 MED ORDER — CYANOCOBALAMIN 1000 MCG/ML IJ SOLN
1000.0000 ug | Freq: Once | INTRAMUSCULAR | Status: AC
  Administered 2011-03-31: 1000 ug via INTRAMUSCULAR

## 2011-04-01 ENCOUNTER — Other Ambulatory Visit: Payer: Self-pay | Admitting: Internal Medicine

## 2011-05-01 ENCOUNTER — Ambulatory Visit (INDEPENDENT_AMBULATORY_CARE_PROVIDER_SITE_OTHER): Payer: Medicare Other | Admitting: Internal Medicine

## 2011-05-01 DIAGNOSIS — E538 Deficiency of other specified B group vitamins: Secondary | ICD-10-CM

## 2011-05-01 MED ORDER — CYANOCOBALAMIN 1000 MCG/ML IJ SOLN
1000.0000 ug | INTRAMUSCULAR | Status: DC
  Administered 2011-05-01 – 2012-02-09 (×10): 1000 ug via INTRAMUSCULAR

## 2011-05-08 ENCOUNTER — Other Ambulatory Visit: Payer: Self-pay | Admitting: Internal Medicine

## 2011-05-11 ENCOUNTER — Other Ambulatory Visit: Payer: Self-pay | Admitting: Internal Medicine

## 2011-05-11 MED ORDER — SULFASALAZINE 500 MG PO TABS: ORAL_TABLET | ORAL | Status: DC

## 2011-05-11 NOTE — Telephone Encounter (Signed)
rx sent

## 2011-05-29 ENCOUNTER — Encounter: Payer: Self-pay | Admitting: *Deleted

## 2011-06-03 ENCOUNTER — Other Ambulatory Visit (INDEPENDENT_AMBULATORY_CARE_PROVIDER_SITE_OTHER): Payer: Medicare Other

## 2011-06-03 ENCOUNTER — Ambulatory Visit (INDEPENDENT_AMBULATORY_CARE_PROVIDER_SITE_OTHER): Payer: Medicare Other | Admitting: Internal Medicine

## 2011-06-03 ENCOUNTER — Telehealth: Payer: Self-pay | Admitting: *Deleted

## 2011-06-03 ENCOUNTER — Encounter: Payer: Self-pay | Admitting: Internal Medicine

## 2011-06-03 VITALS — BP 118/64 | HR 80 | Ht 67.0 in | Wt 184.0 lb

## 2011-06-03 DIAGNOSIS — K5100 Ulcerative (chronic) pancolitis without complications: Secondary | ICD-10-CM

## 2011-06-03 DIAGNOSIS — Z79899 Other long term (current) drug therapy: Secondary | ICD-10-CM

## 2011-06-03 LAB — COMPREHENSIVE METABOLIC PANEL
AST: 18 U/L (ref 0–37)
Alkaline Phosphatase: 67 U/L (ref 39–117)
BUN: 19 mg/dL (ref 6–23)
Creatinine, Ser: 0.9 mg/dL (ref 0.4–1.2)

## 2011-06-03 LAB — CBC WITH DIFFERENTIAL/PLATELET
Eosinophils Relative: 2.9 % (ref 0.0–5.0)
Lymphs Abs: 1.3 10*3/uL (ref 0.7–4.0)
MCHC: 33.7 g/dL (ref 30.0–36.0)
Monocytes Absolute: 0.6 10*3/uL (ref 0.1–1.0)
Monocytes Relative: 11.4 % (ref 3.0–12.0)
Neutro Abs: 3.2 10*3/uL (ref 1.4–7.7)
RBC: 3.72 Mil/uL — ABNORMAL LOW (ref 3.87–5.11)

## 2011-06-03 LAB — IBC PANEL: Saturation Ratios: 21.5 % (ref 20.0–50.0)

## 2011-06-03 MED ORDER — OMEPRAZOLE 20 MG PO CPDR: 20.0000 mg | DELAYED_RELEASE_CAPSULE | Freq: Every day | ORAL | Status: DC

## 2011-06-03 MED ORDER — SULFASALAZINE 500 MG PO TABS: ORAL_TABLET | ORAL | Status: DC

## 2011-06-03 NOTE — Telephone Encounter (Signed)
Spoke with patient and gave her lab results as per Dr. [name]

## 2011-06-03 NOTE — Telephone Encounter (Signed)
Left a message for patient to call me. 

## 2011-06-03 NOTE — Progress Notes (Signed)
Caroline Dean 16-Oct-1940 MRN [id number]   History of Present Illness:  This is a 70 year old white female with ulcerative colitis since 45 which is currently under good control.  Her last flareup was at least 2 years ago. She has been maintained on 6-MP 25 mg a day and Azulfidine 1 g twice a day with folic acid 1 mg daily. She also has a history of B12 deficiency for which she takes B12 1,000 mcg injections monthly. She denies diarrhea, abdominal pain, rectal bleeding. Her last colonoscopy was in October 2010 and showed a hyperplastic polyp. A recall colonoscopy is planned for October 2013.    Past Medical History  Diagnosis Date  . Hyperplastic colon polyp   . Vitamin B12 deficiency   . Diverticulosis   . Internal hemorrhoids   . GERD (gastroesophageal reflux disease)   . Ulcerative colitis   . Fatty liver   . Hyperlipemia    Past Surgical History  Procedure Date  . Tonsillectomy and adenoidectomy   . Dilation and curettage of uterus     reports that she has never smoked. She has never used smokeless tobacco. She reports that she drinks alcohol. She reports that she does not use illicit drugs. family history includes Cirrhosis in her other; Diabetes in her mother; Heart disease in her father; and Lymphoma in her brother.  There is no history of Colon cancer. Allergies  Allergen Reactions  . Codeine     REACTION: nausea/vomiting        Review of Systems: Denies heartburn, dysphagia or shortness of breath or chest pain  The remainder of the 10 point ROS is negative except as outlined in H&P   Physical Exam: General appearance  Well developed, in no distress. Eyes- non icteric. HEENT nontraumatic, normocephalic. Mouth no lesions, tongue papillated, no cheilosis. Neck supple without adenopathy, thyroid not enlarged, no carotid bruits, no JVD. Lungs Clear to auscultation bilaterally. Cor normal S1, normal S2, regular rhythm, no murmur,  quiet precordium. Abdomen:  Mildly protuberant abdomen, soft, nontender. Hyperactive bowel sounds. Liver edge at costal margin. Rectal: Soft Hemoccult negative stool. Extremities no pedal edema. Skin no lesions. Neurological alert and oriented x 3. Psychological normal mood and affect.  Assessment and Plan:  Problem #1 Ulcerative colitis in remission. This was diagnosed 30 years ago. She will continue on 6-MP 25 mg daily and we will reduce her Azulfidine to 1 g a day with folic acid 1 mg daily. We will obtain labs today. She is due to have a B12 shot as well today. We will see her in one year. A recall colonoscopy will be completed in October 2013.   06/03/2011 Delfin Edis

## 2011-06-03 NOTE — Patient Instructions (Addendum)
Your physician has requested that you go to the basement for the following lab work before leaving today: CBC, B12, IBC, CMET We have sent the following medications to your pharmacy for you to pick up at your convenience: Azulfadine  Omeprazole CC: Dr Bea Graff

## 2011-06-03 NOTE — Telephone Encounter (Signed)
Message copied by Hulan Saas on Wed Jun 03, 2011  2:08 PM ------      Message from: Lafayette Dragon      Created: Wed Jun 03, 2011  1:35 PM       Please call pt, all labs normal

## 2011-06-07 ENCOUNTER — Other Ambulatory Visit: Payer: Self-pay | Admitting: Internal Medicine

## 2011-07-08 ENCOUNTER — Ambulatory Visit (INDEPENDENT_AMBULATORY_CARE_PROVIDER_SITE_OTHER): Payer: MEDICARE | Admitting: Internal Medicine

## 2011-07-08 DIAGNOSIS — E538 Deficiency of other specified B group vitamins: Secondary | ICD-10-CM

## 2011-08-10 ENCOUNTER — Ambulatory Visit (INDEPENDENT_AMBULATORY_CARE_PROVIDER_SITE_OTHER): Payer: Medicare Other | Admitting: Internal Medicine

## 2011-08-10 DIAGNOSIS — E538 Deficiency of other specified B group vitamins: Secondary | ICD-10-CM

## 2011-09-11 ENCOUNTER — Ambulatory Visit (INDEPENDENT_AMBULATORY_CARE_PROVIDER_SITE_OTHER): Payer: Medicare Other | Admitting: Internal Medicine

## 2011-09-11 DIAGNOSIS — E538 Deficiency of other specified B group vitamins: Secondary | ICD-10-CM

## 2011-10-09 ENCOUNTER — Ambulatory Visit (INDEPENDENT_AMBULATORY_CARE_PROVIDER_SITE_OTHER): Payer: Medicare Other | Admitting: Internal Medicine

## 2011-10-09 DIAGNOSIS — E538 Deficiency of other specified B group vitamins: Secondary | ICD-10-CM

## 2011-11-09 ENCOUNTER — Ambulatory Visit (INDEPENDENT_AMBULATORY_CARE_PROVIDER_SITE_OTHER): Payer: Medicare Other | Admitting: Internal Medicine

## 2011-11-09 DIAGNOSIS — E538 Deficiency of other specified B group vitamins: Secondary | ICD-10-CM

## 2011-12-10 ENCOUNTER — Ambulatory Visit (INDEPENDENT_AMBULATORY_CARE_PROVIDER_SITE_OTHER): Payer: Medicare Other | Admitting: Internal Medicine

## 2011-12-10 DIAGNOSIS — E538 Deficiency of other specified B group vitamins: Secondary | ICD-10-CM

## 2012-01-08 ENCOUNTER — Ambulatory Visit (INDEPENDENT_AMBULATORY_CARE_PROVIDER_SITE_OTHER): Payer: Medicare Other | Admitting: Internal Medicine

## 2012-01-08 DIAGNOSIS — E538 Deficiency of other specified B group vitamins: Secondary | ICD-10-CM

## 2012-02-09 ENCOUNTER — Ambulatory Visit (INDEPENDENT_AMBULATORY_CARE_PROVIDER_SITE_OTHER): Payer: Medicare Other | Admitting: Internal Medicine

## 2012-02-09 DIAGNOSIS — E538 Deficiency of other specified B group vitamins: Secondary | ICD-10-CM

## 2012-02-25 ENCOUNTER — Encounter: Payer: Self-pay | Admitting: Internal Medicine

## 2012-03-11 ENCOUNTER — Ambulatory Visit (INDEPENDENT_AMBULATORY_CARE_PROVIDER_SITE_OTHER): Payer: Medicare Other | Admitting: Internal Medicine

## 2012-03-11 ENCOUNTER — Encounter: Payer: Self-pay | Admitting: Internal Medicine

## 2012-03-11 DIAGNOSIS — E538 Deficiency of other specified B group vitamins: Secondary | ICD-10-CM

## 2012-03-11 MED ORDER — CYANOCOBALAMIN 1000 MCG/ML IJ SOLN
1000.0000 ug | Freq: Once | INTRAMUSCULAR | Status: AC
  Administered 2012-03-11: 1000 ug via INTRAMUSCULAR

## 2012-03-15 ENCOUNTER — Telehealth: Payer: Self-pay | Admitting: Internal Medicine

## 2012-03-15 NOTE — Telephone Encounter (Signed)
It could be beginning of colitis flare up. Please send Canasa supp 1050m,#30, 1 qhs

## 2012-03-15 NOTE — Telephone Encounter (Signed)
Last OV 06/03/11 for yearly f/u. Hx of UC since 1981 and maintained on 6MP 25 mg/day and Azulfidine 1 G daily; she also has B12 deficiency which she has monthly B12 injections here. Last COLON 03/2009 with hyperplastic polyp and she is scheduled for Direct COLON on 05/13/12.  Pt reports bloody mucus 1st thing in the am for several weeks on and off and daily for the last few days. She is beginning to ache, but he stools are normal. Please advise.thanks.

## 2012-03-16 MED ORDER — MESALAMINE 1000 MG RE SUPP: RECTAL | Status: DC

## 2012-03-16 NOTE — Telephone Encounter (Signed)
lmom for pt to call back

## 2012-03-16 NOTE — Telephone Encounter (Signed)
Informed pt of Dr Nichola Sizer order for [facility]; she stated understanding and will call back for questions.

## 2012-04-11 ENCOUNTER — Ambulatory Visit (INDEPENDENT_AMBULATORY_CARE_PROVIDER_SITE_OTHER): Payer: Medicare Other | Admitting: Internal Medicine

## 2012-04-11 DIAGNOSIS — E538 Deficiency of other specified B group vitamins: Secondary | ICD-10-CM

## 2012-04-11 MED ORDER — CYANOCOBALAMIN 1000 MCG/ML IJ SOLN: 1000.0000 ug | Freq: Once | INTRAMUSCULAR | Status: DC

## 2012-04-29 ENCOUNTER — Encounter: Payer: Self-pay | Admitting: Internal Medicine

## 2012-04-29 ENCOUNTER — Ambulatory Visit (AMBULATORY_SURGERY_CENTER): Payer: Medicare Other

## 2012-04-29 VITALS — Ht 67.0 in | Wt 191.0 lb

## 2012-04-29 DIAGNOSIS — Z1211 Encounter for screening for malignant neoplasm of colon: Secondary | ICD-10-CM

## 2012-05-13 ENCOUNTER — Ambulatory Visit (AMBULATORY_SURGERY_CENTER): Payer: Medicare Other | Admitting: Internal Medicine

## 2012-05-13 ENCOUNTER — Encounter: Payer: Self-pay | Admitting: Internal Medicine

## 2012-05-13 ENCOUNTER — Other Ambulatory Visit: Payer: Self-pay | Admitting: *Deleted

## 2012-05-13 VITALS — BP 138/77 | HR 64 | Temp 97.5°F | Resp 16 | Ht 67.0 in | Wt 181.0 lb

## 2012-05-13 DIAGNOSIS — K5150 Left sided colitis without complications: Secondary | ICD-10-CM

## 2012-05-13 DIAGNOSIS — Z8719 Personal history of other diseases of the digestive system: Secondary | ICD-10-CM

## 2012-05-13 DIAGNOSIS — Z1211 Encounter for screening for malignant neoplasm of colon: Secondary | ICD-10-CM

## 2012-05-13 DIAGNOSIS — D126 Benign neoplasm of colon, unspecified: Secondary | ICD-10-CM

## 2012-05-13 DIAGNOSIS — K5289 Other specified noninfective gastroenteritis and colitis: Secondary | ICD-10-CM

## 2012-05-13 DIAGNOSIS — K648 Other hemorrhoids: Secondary | ICD-10-CM

## 2012-05-13 MED ORDER — SODIUM CHLORIDE 0.9 % IV SOLN: 500.0000 mL | INTRAVENOUS | Status: DC

## 2012-05-13 MED ORDER — MERCAPTOPURINE 50 MG PO TABS: 50.0000 mg | ORAL_TABLET | Freq: Every day | ORAL | Status: DC

## 2012-05-13 NOTE — Progress Notes (Signed)
Patient did not have preoperative order for IV antibiotic SSI prophylaxis. [phone]) Patient did not have preoperative order for IV antibiotic SSI prophylaxis. [phone]) Patient did not experience any of the following events: a burn prior to discharge; a fall within the facility; wrong site/side/patient/procedure/implant event; or a hospital transfer or hospital admission upon discharge from the facility. [phone]) Patient did not experience any of the following events: a burn prior to discharge; a fall within the facility; wrong site/side/patient/procedure/implant event; or a hospital transfer or hospital admission upon discharge from the facility. [phone])

## 2012-05-13 NOTE — Telephone Encounter (Signed)
Per Dr [name], she just did a colonoscopy on patient and patient has active colitis. Therefore, she would like patient to have mercaptopurine 50 mg tablets. Rx sent.

## 2012-05-13 NOTE — Patient Instructions (Signed)
YOU HAD AN ENDOSCOPIC PROCEDURE TODAY AT [facility]: Refer to the procedure report that was given to you for any specific questions about what was found during the examination.  If the procedure report does not answer your questions, please call your gastroenterologist to clarify.  If you requested that your care partner not be given the details of your procedure findings, then the procedure report has been included in a sealed envelope for you to review at your convenience later.  YOU SHOULD EXPECT: Some feelings of bloating in the abdomen. Passage of more gas than usual.  Walking can help get rid of the air that was put into your GI tract during the procedure and reduce the bloating. If you had a lower endoscopy (such as a colonoscopy or flexible sigmoidoscopy) you may notice spotting of blood in your stool or on the toilet paper. If you underwent a bowel prep for your procedure, then you may not have a normal bowel movement for a few days.  DIET: Your first meal following the procedure should be a light meal and then it is ok to progress to your normal diet.  A half-sandwich or bowl of soup is an example of a good first meal.  Heavy or fried foods are harder to digest and may make you feel nauseous or bloated.  Likewise meals heavy in dairy and vegetables can cause extra gas to form and this can also increase the bloating.  Drink plenty of fluids but you should avoid alcoholic beverages for 24 hours.  ACTIVITY: Your care partner should take you home directly after the procedure.  You should plan to take it easy, moving slowly for the rest of the day.  You can resume normal activity the day after the procedure however you should NOT DRIVE or use heavy machinery for 24 hours (because of the sedation medicines used during the test).    SYMPTOMS TO REPORT IMMEDIATELY: A gastroenterologist can be reached at any hour.  During normal business hours, 8:30 AM to 5:00 PM Monday through Friday,  call [phone].  After hours and on weekends, please call the GI answering service at [phone] who will take a message and have the physician on call contact you.   Following lower endoscopy (colonoscopy or flexible sigmoidoscopy):  Excessive amounts of blood in the stool  Significant tenderness or worsening of abdominal pains  Swelling of the abdomen that is new, acute  Fever of 100F or higher    FOLLOW UP: If any biopsies were taken you will be contacted by phone or by letter within the next 1-3 weeks.  Call your gastroenterologist if you have not heard about the biopsies in 3 weeks.  Our staff will call the home number listed on your records the next business day following your procedure to check on you and address any questions or concerns that you may have at that time regarding the information given to you following your procedure. This is a courtesy call and so if there is no answer at the home number and we have not heard from you through the emergency physician on call, we will assume that you have returned to your regular daily activities without incident.  SIGNATURES/CONFIDENTIALITY: You and/or your care partner have signed paperwork which will be entered into your electronic medical record.  These signatures attest to the fact that that the information above on your After Visit Summary has been reviewed and is understood.  Full responsibility of the confidentiality  of this discharge information lies with you and/or your care-partner.     AWAIT PATHOLOGY RESULTS  CONTINUE 6MP 50 MG DAILY & AZULFIDINE 500 MG (4/DAY)

## 2012-05-13 NOTE — Op Note (Signed)
[facility]  [address] [place], [id number]   COLONOSCOPY PROCEDURE REPORT  PATIENT: Caroline Dean, Caroline Dean.  MR#: [id number] BIRTHDATE: [birth year] , 71  yrs. old GENDER: Female ENDOSCOPIST: Lafayette Dragon, MD REFERRED BY:  Gilford Rile, M.D. PROCEDURE DATE:  05/13/2012 PROCEDURE:   Colonoscopy with biopsy ASA CLASS:   Class II INDICATIONS:High risk patient with personal history of colonic polyps and High risk patient with previously diagnosed UC left-sided colitis. ,last colon2007, 2010- hyperplastic polyp, no active colitis, UC since 1981 MEDICATIONS: MAC sedation, administered by CRNA and propofol (Diprivan) 122m IV  DESCRIPTION OF PROCEDURE:   After the risks and benefits and of the procedure were explained, informed consent was obtained.  A digital rectal exam revealed no abnormalities of the rectum.    The LB [id number]-[id number] endoscope was introduced through the anus and advanced to the cecum, which was identified by both the appendix and ileocecal valve .  The quality of the prep was good, using MoviPrep .  The instrument was then slowly withdrawn as the colon was fully examined.   Mildly active colitis 20cm - 70 cm, erythema and edema, no bleeding or ulcerations, decreased haustral folds, , mild diverticulosis of th sigmoid colon, biopsies of the left colon    0-30cm, 40-70cm and of the right colon to cecum to r/o dysplasia, small internal hemorrhoids       Retroflexed views revealed no abnormalities. The scope was then withdrawn from the patient and the procedure completed.  COMPLICATIONS: There were no complications. ENDOSCOPIC IMPRESSION:  mild left sided colitis, 20- 70 cm, s/p random biopsies to r/p dysplasia lest sided diverticulosis, unchanged RECOMMENDATIONS: Await pathology results 6MP 50 mg daily Azulfidine 5034m4/day   REPEAT EXAM: In 3 year(s)  for Colonoscopy.  cc:  _______________________________ eSigned: Lafayette Dragon MD 05/13/2012 10:16 AM     PATIENT NAME:  Caroline Dean, BillingMR#: [id number]

## 2012-05-16 ENCOUNTER — Telehealth: Payer: Self-pay | Admitting: *Deleted

## 2012-05-16 NOTE — Telephone Encounter (Signed)
  Follow up Call-  Call back number 05/13/2012  Post procedure Call Back phone  # [phone]  Permission to leave phone message Yes     Patient questions:  Do you have a fever, pain , or abdominal swelling? no Pain Score  0 *  Have you tolerated food without any problems? yes  Have you been able to return to your normal activities? yes  Do you have any questions about your discharge instructions: Diet   no Medications  no Follow up visit  no  Do you have questions or concerns about your Care? no  Actions: * If pain score is 4 or above: No action needed, pain <4.

## 2012-05-18 ENCOUNTER — Ambulatory Visit (INDEPENDENT_AMBULATORY_CARE_PROVIDER_SITE_OTHER): Payer: Medicare Other | Admitting: Internal Medicine

## 2012-05-18 ENCOUNTER — Encounter: Payer: Self-pay | Admitting: Internal Medicine

## 2012-05-18 DIAGNOSIS — E538 Deficiency of other specified B group vitamins: Secondary | ICD-10-CM

## 2012-05-18 MED ORDER — CYANOCOBALAMIN 1000 MCG/ML IJ SOLN
1000.0000 ug | Freq: Once | INTRAMUSCULAR | Status: AC
  Administered 2012-05-18: 1000 ug via INTRAMUSCULAR

## 2012-05-18 NOTE — Progress Notes (Signed)
Pt came in for Vit B-12 injection and asked about colonoscopy report.  Pt given copy of letter, colon and path report for her information.

## 2012-06-13 ENCOUNTER — Other Ambulatory Visit: Payer: Self-pay | Admitting: *Deleted

## 2012-06-13 MED ORDER — SULFASALAZINE 500 MG PO TABS: ORAL_TABLET | ORAL | Status: DC

## 2012-06-20 ENCOUNTER — Other Ambulatory Visit: Payer: Self-pay | Admitting: *Deleted

## 2012-06-20 ENCOUNTER — Telehealth: Payer: Self-pay | Admitting: Internal Medicine

## 2012-06-20 MED ORDER — OMEPRAZOLE 20 MG PO CPDR: 20.0000 mg | DELAYED_RELEASE_CAPSULE | Freq: Every day | ORAL | Status: DC

## 2012-06-20 NOTE — Telephone Encounter (Signed)
Patient was seen by her primary care for a rash and itching that started after taking canasa, they treated her with prednisone x 2 times since October.  She has stopped the canasa in October as she has not needed it.  They are sending her to derm due to continued rash.  They are questioning if she could be allergic to azulfidine.  She is having a itching rash to trunk, abdomen, and chest and arms. While on Prednisone she did not have a rash or itching.  Dr. [name] please advise if any changes need to be made to her meds. The derm appt is pending.

## 2012-06-20 NOTE — Telephone Encounter (Signed)
Please stop Azulfidine and Folic acid and continue 6MP and Canasa supp 1000 mg prn.

## 2012-06-21 ENCOUNTER — Ambulatory Visit (INDEPENDENT_AMBULATORY_CARE_PROVIDER_SITE_OTHER): Payer: Medicare Other | Admitting: Internal Medicine

## 2012-06-21 DIAGNOSIS — E538 Deficiency of other specified B group vitamins: Secondary | ICD-10-CM

## 2012-06-21 MED ORDER — CYANOCOBALAMIN 1000 MCG/ML IJ SOLN
1000.0000 ug | Freq: Once | INTRAMUSCULAR | Status: AC
  Administered 2012-06-21: 1000 ug via INTRAMUSCULAR

## 2012-06-21 NOTE — Progress Notes (Signed)
Patient made appointment to come see Dr. [name].  First available is early Feb.

## 2012-06-21 NOTE — Telephone Encounter (Signed)
Patient advised.  She will call back if rash is not relieved by stopping the above meds and she sees dermatology on Monday

## 2012-06-24 ENCOUNTER — Encounter: Payer: Self-pay | Admitting: *Deleted

## 2012-07-27 ENCOUNTER — Encounter: Payer: Self-pay | Admitting: Internal Medicine

## 2012-07-27 ENCOUNTER — Ambulatory Visit (INDEPENDENT_AMBULATORY_CARE_PROVIDER_SITE_OTHER): Payer: Medicare Other | Admitting: Internal Medicine

## 2012-07-27 VITALS — BP 150/80 | HR 66 | Ht 66.0 in | Wt 191.0 lb

## 2012-07-27 DIAGNOSIS — D849 Immunodeficiency, unspecified: Secondary | ICD-10-CM

## 2012-07-27 DIAGNOSIS — K5100 Ulcerative (chronic) pancolitis without complications: Secondary | ICD-10-CM

## 2012-07-27 DIAGNOSIS — E538 Deficiency of other specified B group vitamins: Secondary | ICD-10-CM

## 2012-07-27 MED ORDER — MERCAPTOPURINE 50 MG PO TABS: 50.0000 mg | ORAL_TABLET | Freq: Every day | ORAL | Status: DC

## 2012-07-27 MED ORDER — OMEPRAZOLE 20 MG PO CPDR: 20.0000 mg | DELAYED_RELEASE_CAPSULE | Freq: Every day | ORAL | Status: DC

## 2012-07-27 MED ORDER — CYANOCOBALAMIN 1000 MCG/ML IJ SOLN
1000.0000 ug | Freq: Once | INTRAMUSCULAR | Status: AC
  Administered 2012-07-27: 1000 ug via INTRAMUSCULAR

## 2012-07-27 NOTE — Patient Instructions (Addendum)
We have given you a b12 injection today. Please schedule your next injection for 1 month from now.  We have sent the following medications to your pharmacy for you to pick up at your convenience: 6MP, Omeprazole  CC: Dr Gilford Rile

## 2012-07-27 NOTE — Progress Notes (Signed)
Caroline Dean [birth year] MRN [id number]  History of Present Illness:  This is a 72 year old white female with ulcerative colitis since 1981 who comes for refill of her medications. She had a recent colonoscopy which showed moderately active colitis from 20-70 cm. She is on sulfasalazine 500 mg 2 tablets twice a day and 6-MP 50 mg daily. She denies rectal bleeding or abdominal pain. She had an episode of a rash which was initially thought to be related Azulfidine.l   Past Medical History  Diagnosis Date  . Hyperplastic colon polyp   . Vitamin B12 deficiency   . Diverticulosis   . Internal hemorrhoids   . GERD (gastroesophageal reflux disease)   . Ulcerative colitis   . Fatty liver   . Hyperlipemia   . Iron deficiency anemia, unspecified   . Osteoarthritis    Past Surgical History  Procedure Date  . Tonsillectomy and adenoidectomy   . Dilation and curettage of uterus     reports that she has never smoked. She has never used smokeless tobacco. She reports that she drinks alcohol. She reports that she does not use illicit drugs. family history includes Cirrhosis in her other; Diabetes in her mother; Heart disease in her father; and Lymphoma in her brother.  There is no history of Colon cancer. Allergies  Allergen Reactions  . Codeine     REACTION: nausea/vomiting  . Canasa (Mesalamine) Rash        Review of Systems: Negative for abdominal pain rectal bleeding  The remainder of the 10 point ROS is negative except as outlined in H&P   Physical Exam: General appearance  Well developed, in no distress. Psychological normal mood and affect.  Assessment and Plan:  Problem #1 Ulcerative colitis, in remission. Patient is status post recent colonoscopy indicating mild colitis in the left colon. The plan is to continue on 6-MP 50 mg daily and Azulfidine 2 g daily. A recall colonoscopy will be due in October 2016.   07/27/2012 Delfin Edis

## 2012-08-02 ENCOUNTER — Other Ambulatory Visit: Payer: Self-pay | Admitting: *Deleted

## 2012-08-02 MED ORDER — FOLIC ACID 1 MG PO TABS: 1.0000 mg | ORAL_TABLET | Freq: Every day | ORAL | Status: DC

## 2012-12-09 ENCOUNTER — Other Ambulatory Visit: Payer: Self-pay

## 2012-12-09 MED ORDER — SULFASALAZINE 500 MG PO TABS: 1000.0000 mg | ORAL_TABLET | Freq: Two times a day (BID) | ORAL | Status: DC

## 2013-06-12 ENCOUNTER — Other Ambulatory Visit: Payer: Self-pay | Admitting: *Deleted

## 2013-06-12 MED ORDER — FOLIC ACID 1 MG PO TABS: 1.0000 mg | ORAL_TABLET | Freq: Every day | ORAL | Status: DC

## 2013-07-12 ENCOUNTER — Other Ambulatory Visit: Payer: Self-pay | Admitting: *Deleted

## 2013-07-12 MED ORDER — MERCAPTOPURINE 50 MG PO TABS: 50.0000 mg | ORAL_TABLET | Freq: Every day | ORAL | Status: DC

## 2013-08-09 ENCOUNTER — Other Ambulatory Visit: Payer: Self-pay | Admitting: *Deleted

## 2013-08-09 MED ORDER — SULFASALAZINE 500 MG PO TABS: 1000.0000 mg | ORAL_TABLET | Freq: Two times a day (BID) | ORAL | Status: DC

## 2013-08-11 ENCOUNTER — Encounter: Payer: Self-pay | Admitting: Internal Medicine

## 2013-08-11 ENCOUNTER — Ambulatory Visit (INDEPENDENT_AMBULATORY_CARE_PROVIDER_SITE_OTHER): Payer: Medicare Other | Admitting: Internal Medicine

## 2013-08-11 VITALS — BP 134/80 | HR 88 | Ht 66.0 in | Wt 185.0 lb

## 2013-08-11 DIAGNOSIS — D849 Immunodeficiency, unspecified: Secondary | ICD-10-CM

## 2013-08-11 DIAGNOSIS — E538 Deficiency of other specified B group vitamins: Secondary | ICD-10-CM

## 2013-08-11 DIAGNOSIS — K5150 Left sided colitis without complications: Secondary | ICD-10-CM

## 2013-08-11 NOTE — Progress Notes (Signed)
ELYCE ZOLLINGER Jun 25, 1940 [id number]  Note: This dictation was prepared with Dragon digital system. Any transcriptional errors that result from this procedure are unintentional.   History of Present Illness: This is a 73 year old white female with ulcerative colitis or since 59. She comes for refill of sulfasalazine and 6 MP. Last colonoscopy in November 2013 showed mildly active colitis from 20-70 cm. Bx's showed minimally active chronic colitis and moderately active colitis from 0-40 cm. She remains asymptomatic. She underwent lithotripsy last month  for left uretertal stone. She denies rectal bleeding diarrhea or abdominal pain.    Past Medical History  Diagnosis Date  . Hyperplastic colon polyp   . Vitamin B12 deficiency   . Diverticulosis   . Internal hemorrhoids   . GERD (gastroesophageal reflux disease)   . Ulcerative colitis   . Fatty liver   . Hyperlipemia   . Iron deficiency anemia, unspecified   . Osteoarthritis     Past Surgical History  Procedure Laterality Date  . Tonsillectomy and adenoidectomy    . Dilation and curettage of uterus      Allergies  Allergen Reactions  . Codeine     REACTION: nausea/vomiting  . Canasa [Mesalamine] Rash    Family history and social history have been reviewed.  Review of Systems:   The remainder of the 10 point ROS is negative except as outlined in the H&P  Physical Exam: General Appearance Well developed, in no distress Psychological Normal mood and affect  Assessment and Plan:   73 year old white female with the predominantly left-sided ulcerative colitis under good control with 6-MP and sulfasalazine 2 g daily. We will refill her medications. Her last B12 level was excellent and I suggested  To discontinue B12 for next 6 months and recheck in 6 months.. Next recall colonoscopy planned for October 2016. She will continue folic acid 1 mg daily. I will see her in one year    Delfin Edis 08/11/2013

## 2013-08-11 NOTE — Patient Instructions (Signed)
Caroline Dean,  Dr Letha Cape

## 2013-08-21 ENCOUNTER — Other Ambulatory Visit: Payer: Self-pay | Admitting: *Deleted

## 2013-08-21 MED ORDER — MERCAPTOPURINE 50 MG PO TABS: 50.0000 mg | ORAL_TABLET | Freq: Every day | ORAL | Status: DC

## 2013-09-05 ENCOUNTER — Other Ambulatory Visit: Payer: Self-pay | Admitting: *Deleted

## 2013-09-05 MED ORDER — OMEPRAZOLE 20 MG PO CPDR: 20.0000 mg | DELAYED_RELEASE_CAPSULE | Freq: Every day | ORAL | Status: DC

## 2013-10-23 ENCOUNTER — Other Ambulatory Visit: Payer: Self-pay | Admitting: *Deleted

## 2013-10-23 MED ORDER — FOLIC ACID 1 MG PO TABS: 1.0000 mg | ORAL_TABLET | Freq: Every day | ORAL | Status: DC

## 2013-11-20 ENCOUNTER — Other Ambulatory Visit: Payer: Self-pay | Admitting: *Deleted

## 2013-11-20 MED ORDER — SULFASALAZINE 500 MG PO TABS: 1000.0000 mg | ORAL_TABLET | Freq: Two times a day (BID) | ORAL | Status: DC

## 2014-01-22 ENCOUNTER — Other Ambulatory Visit: Payer: Self-pay | Admitting: *Deleted

## 2014-01-22 MED ORDER — MERCAPTOPURINE 50 MG PO TABS: 50.0000 mg | ORAL_TABLET | Freq: Every day | ORAL | Status: DC

## 2014-02-01 ENCOUNTER — Encounter: Payer: Self-pay | Admitting: Internal Medicine

## 2014-05-04 ENCOUNTER — Other Ambulatory Visit: Payer: Self-pay | Admitting: Internal Medicine

## 2014-05-04 ENCOUNTER — Other Ambulatory Visit: Payer: Self-pay | Admitting: *Deleted

## 2014-05-04 MED ORDER — OMEPRAZOLE 20 MG PO CPDR: 20.0000 mg | DELAYED_RELEASE_CAPSULE | Freq: Every day | ORAL | Status: DC

## 2014-05-23 ENCOUNTER — Other Ambulatory Visit: Payer: Self-pay | Admitting: Internal Medicine

## 2014-05-25 ENCOUNTER — Other Ambulatory Visit: Payer: Self-pay | Admitting: Internal Medicine

## 2014-05-28 ENCOUNTER — Telehealth: Payer: Self-pay | Admitting: Internal Medicine

## 2014-05-28 MED ORDER — MERCAPTOPURINE 50 MG PO TABS: 50.0000 mg | ORAL_TABLET | Freq: Every day | ORAL | Status: DC

## 2014-05-28 NOTE — Telephone Encounter (Signed)
Patient has scheduled appt for 07/06/14. She already has rx for sulfasalazine but needs refill on 59m. I will send enough to get her through until her appointment with Dr BOlevia Perches

## 2014-06-11 ENCOUNTER — Other Ambulatory Visit: Payer: Self-pay | Admitting: Internal Medicine

## 2014-07-03 ENCOUNTER — Other Ambulatory Visit: Payer: Self-pay | Admitting: Internal Medicine

## 2014-07-06 ENCOUNTER — Encounter: Payer: Self-pay | Admitting: Internal Medicine

## 2014-07-06 ENCOUNTER — Ambulatory Visit (INDEPENDENT_AMBULATORY_CARE_PROVIDER_SITE_OTHER): Payer: Medicare Other | Admitting: Internal Medicine

## 2014-07-06 VITALS — BP 130/80 | HR 83 | Ht 66.0 in | Wt 189.0 lb

## 2014-07-06 DIAGNOSIS — K5150 Left sided colitis without complications: Secondary | ICD-10-CM

## 2014-07-06 MED ORDER — FOLIC ACID 1 MG PO TABS: 1.0000 mg | ORAL_TABLET | Freq: Every day | ORAL | Status: DC

## 2014-07-06 MED ORDER — MERCAPTOPURINE 50 MG PO TABS: 50.0000 mg | ORAL_TABLET | Freq: Every day | ORAL | Status: DC

## 2014-07-06 MED ORDER — SULFASALAZINE 500 MG PO TABS: 1000.0000 mg | ORAL_TABLET | Freq: Two times a day (BID) | ORAL | Status: DC

## 2014-07-06 NOTE — Patient Instructions (Addendum)
Your refills have been sent to your pharmacy Cc. Dr Bea Graff

## 2014-07-06 NOTE — Progress Notes (Signed)
Caroline Dean December 14, 1940 [id number]  Note: This dictation was prepared with Dragon digital system. Any transcriptional errors that result from this procedure are unintentional.   History of Present Illness: This is a 74 year old white female with ulcerative colitis since 1981 predominantly left-sided. Under good control on sulfasalazine 1 g twice a day and 6 MP 50 mg a day. And folic acid 1 mg daily. Last colonoscopy 2007, 2010 ( hyperplastic polyp) and most recently in November 2013 showed mildly active colitis from 40-70 cm and moderately severe colitis from 0-40 cm. There was no dysplasia. She will be due for recall colonoscopy in November 2016. Last appointment was in February 2015. She denies diarrhea, rectal bleeding or abdominal pain. Her last blood count in November 2015 was 12.3 hematocrit 38.9. MCV 105. White blood cell count 4400 and platelet count of 261,000.    Past Medical History  Diagnosis Date  . Hyperplastic colon polyp   . Vitamin B12 deficiency   . Diverticulosis   . Internal hemorrhoids   . GERD (gastroesophageal reflux disease)   . Ulcerative colitis   . Fatty liver   . Hyperlipemia   . Iron deficiency anemia, unspecified   . Osteoarthritis     Past Surgical History  Procedure Laterality Date  . Tonsillectomy and adenoidectomy    . Dilation and curettage of uterus      Allergies  Allergen Reactions  . Codeine     REACTION: nausea/vomiting  . Canasa [Mesalamine] Rash    Family history and social history have been reviewed.  Review of Systems: Negative for weight loss, rectal bleeding or abdominal pain  The remainder of the 10 point ROS is negative except as outlined in the H&P  Physical Exam: General Appearance Well developed, in no distress Eyes  Non icteric  HEENT  Non traumatic, normocephalic  Mouth No lesion, tongue papillated, no cheilosis Neck Supple without adenopathy, thyroid not enlarged, no carotid bruits, no JVD Lungs Clear to  auscultation bilaterally COR Normal S1, normal S2, regular rhythm, no murmur, quiet precordium Abdomen soft nontender abdomen with normoactive bowel sounds. No distention. Her edge at costal margin Rectal not done Extremities  No pedal edema Skin No lesions Neurological Alert and oriented x 3 Psychological Normal mood and affect  Assessment and Plan:   74 year old white female with history of ulcerative colitis of greater than 30 years duration. She will be due for recall colonoscopy in November 2016. We will refill her sulfasalazine and 6-MP. She will have repeat blood count in March 2016 in Dr Willette Pa office.     Delfin Edis @TODAY (<PARAMETER> error)@

## 2014-07-12 ENCOUNTER — Other Ambulatory Visit: Payer: Self-pay | Admitting: Internal Medicine

## 2015-01-24 DIAGNOSIS — R0602 Shortness of breath: Secondary | ICD-10-CM

## 2015-01-24 DIAGNOSIS — R002 Palpitations: Secondary | ICD-10-CM | POA: Insufficient documentation

## 2015-01-24 DIAGNOSIS — E785 Hyperlipidemia, unspecified: Secondary | ICD-10-CM

## 2015-01-24 HISTORY — DX: Shortness of breath: R06.02

## 2015-01-24 HISTORY — DX: Hyperlipidemia, unspecified: E78.5

## 2015-01-24 HISTORY — DX: Palpitations: R00.2

## 2015-02-18 ENCOUNTER — Other Ambulatory Visit: Payer: Self-pay | Admitting: Internal Medicine

## 2015-02-18 ENCOUNTER — Telehealth: Payer: Self-pay | Admitting: *Deleted

## 2015-02-18 NOTE — Telephone Encounter (Signed)
I contacted patient to advise her that Dr [name] retired 02/14/15, therefore we would need to choose another Dr in our practice to refill her omeprazole. Patient states that she will actually probably to go  for GI care. She will have her PCP to refill omeprazole until she decides for sure. Patient asks how she can go about getting records and I advised that she would need to sign a release of information and we would be glad to send records to whomever she releases them to. She verbalizes understanding.

## 2015-10-28 DIAGNOSIS — M159 Polyosteoarthritis, unspecified: Secondary | ICD-10-CM

## 2015-10-28 DIAGNOSIS — E559 Vitamin D deficiency, unspecified: Secondary | ICD-10-CM

## 2015-10-28 DIAGNOSIS — N200 Calculus of kidney: Secondary | ICD-10-CM

## 2015-10-28 DIAGNOSIS — R5383 Other fatigue: Secondary | ICD-10-CM

## 2015-10-28 DIAGNOSIS — M150 Primary generalized (osteo)arthritis: Secondary | ICD-10-CM

## 2015-10-28 DIAGNOSIS — K2100 Gastro-esophageal reflux disease with esophagitis, without bleeding: Secondary | ICD-10-CM | POA: Insufficient documentation

## 2015-10-28 DIAGNOSIS — E782 Mixed hyperlipidemia: Secondary | ICD-10-CM

## 2015-10-28 DIAGNOSIS — F419 Anxiety disorder, unspecified: Secondary | ICD-10-CM

## 2015-10-28 DIAGNOSIS — M8949 Other hypertrophic osteoarthropathy, multiple sites: Secondary | ICD-10-CM

## 2015-10-28 DIAGNOSIS — Z79899 Other long term (current) drug therapy: Secondary | ICD-10-CM | POA: Insufficient documentation

## 2015-10-28 DIAGNOSIS — M810 Age-related osteoporosis without current pathological fracture: Secondary | ICD-10-CM | POA: Insufficient documentation

## 2015-10-28 DIAGNOSIS — R5381 Other malaise: Secondary | ICD-10-CM

## 2015-10-28 HISTORY — DX: Vitamin D deficiency, unspecified: E55.9

## 2015-10-28 HISTORY — DX: Gastro-esophageal reflux disease with esophagitis, without bleeding: K21.00

## 2015-10-28 HISTORY — DX: Polyosteoarthritis, unspecified: M15.9

## 2015-10-28 HISTORY — DX: Other fatigue: R53.81

## 2015-10-28 HISTORY — DX: Primary generalized (osteo)arthritis: M15.0

## 2015-10-28 HISTORY — DX: Mixed hyperlipidemia: E78.2

## 2015-10-28 HISTORY — DX: Anxiety disorder, unspecified: F41.9

## 2015-10-28 HISTORY — DX: Other hypertrophic osteoarthropathy, multiple sites: M89.49

## 2015-10-28 HISTORY — DX: Other long term (current) drug therapy: Z79.899

## 2015-10-28 HISTORY — DX: Age-related osteoporosis without current pathological fracture: M81.0

## 2015-10-28 HISTORY — DX: Other fatigue: R53.83

## 2015-10-28 HISTORY — DX: Calculus of kidney: N20.0

## 2017-05-26 ENCOUNTER — Ambulatory Visit: Payer: Medicare Other | Admitting: Sports Medicine

## 2017-05-26 ENCOUNTER — Encounter: Payer: Self-pay | Admitting: Sports Medicine

## 2017-05-26 VITALS — BP 143/78 | HR 54 | Ht 66.0 in | Wt 190.0 lb

## 2017-05-26 DIAGNOSIS — B070 Plantar wart: Secondary | ICD-10-CM | POA: Diagnosis not present

## 2017-05-26 DIAGNOSIS — M79672 Pain in left foot: Secondary | ICD-10-CM | POA: Diagnosis not present

## 2017-05-26 NOTE — Progress Notes (Signed)
Subjective: Caroline Dean is a 76 y.o. female patient who presents to office for evaluation of Left foot pain secondary to painful wart at the ball x 2-3 months. Patient has tried OTC wart solution, cushion which helps and changing shoes with no relief in symptoms. Worse after walks for exercise in the morning. Admits to history of ulcerative colitis. Patient denies any other pedal complaints.   ROS All systems Negative  Patient Active Problem List   Diagnosis Date Noted  . B12 DEFICIENCY 04/24/2009  . FATTY LIVER DISEASE 05/11/2008  . IRON DEFICIENCY 08/25/2007  . AGITATION 08/15/2007  . INTERNAL HEMORRHOIDS 08/15/2007  . GERD 08/15/2007  . ULCERATIVE COLITIS 08/15/2007  . 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/2007    Current Outpatient Medications on File Prior to Visit  Medication Sig Dispense Refill  . ALPRAZolam (XANAX) 0.25 MG tablet     . amLODipine (NORVASC) 2.5 MG tablet Take 2.5 mg by mouth.    . Cholecalciferol (VITAMIN D-1000 MAX ST) 1000 units tablet Take by mouth.    . ferrous sulfate 325 (65 FE) MG tablet Take 325 mg by mouth daily with breakfast.    . folic acid (FOLVITE) 1 MG tablet Take 1 tablet (1 mg total) by mouth daily. 100 tablet 6  . mercaptopurine (PURINETHOL) 50 MG tablet Take 1 tablet (50 mg total) by mouth daily. Give on an empty stomach 1 hour before or 2 hours after meals. Caution: Chemotherapy. 30 tablet 10  . metoprolol tartrate (LOPRESSOR) 50 MG tablet Take 50 mg by mouth.    Marland Kitchen omeprazole (PRILOSEC) 20 MG capsule TAKE 1 CAPSULE BY MOUTH ONCE DAILY 30 capsule 3  . simvastatin (ZOCOR) 20 MG tablet One tablet by mouth daily     . sulfaSALAzine (AZULFIDINE) 500 MG tablet Take 2 tablets (1,000 mg total) by mouth 2 (two) times daily. 120 tablet 12  . fish oil-omega-3 fatty acids 1000 MG capsule Take 1 g by mouth daily.     No current facility-administered medications on file prior to visit.     Allergies  Allergen Reactions  . Codeine Nausea And Vomiting  .  Mesalamine Rash    Objective:  General: Alert and oriented x3 in no acute distress  Dermatology: Keratotic lesion present measuring <0.5cm at plantar left forefoot x 1 with no skin lines transversing the lesion, pain is present with medial lateral pressure to the lesion, capillaries with pin point bleeding noted, no webspace macerations, no ecchymosis bilateral, all nails x 10 are well manicured.  Vascular: Dorsalis Pedis and Posterior Tibial pedal pulses 1/4, Capillary Fill Time 3 seconds, + scant pedal hair growth bilateral, trace edema bilateral lower extremities, + brawny pigmentation, Temperature gradient within normal limits.  Neurology: Gross sensation intact via light touch bilateral.  Musculoskeletal: Mild tenderness with palpation at the lesion site on Left, + Bunion and hammertoe, Muscular strength 5/5 in all groups without pain or limitation on range of motion.  Assessment and Plan: Problem List Items Addressed This Visit    None    Visit Diagnoses    Plantar wart of left foot    -  Primary   Left foot pain         -Complete examination performed -Discussed treatment options for wart -Parred keratoic warty lesion using a chisel blade; treated the area with Catharidin covered with bandaid; Advised patient of blistering reaction that will occur from application of medication and once this happens replace bandaid with neosporin and tape/bandaid -Will Rx Tagamet and Efudex  cream next visit if continues to be problematic  -Patient to return to office in 2-3 weeks or sooner if condition worsens.  Landis Martins, DPM

## 2017-05-26 NOTE — Progress Notes (Signed)
   Subjective:    Patient ID: Caroline Dean, female    DOB: September 18, 1940, 76 y.o.   MRN: [id number]  HPI    Review of Systems  All other systems reviewed and are negative.      Objective:   Physical Exam        Assessment & Plan:

## 2017-05-26 NOTE — Patient Instructions (Signed)
Plantar Warts Plantar warts are small growths on the bottom of the foot (sole). Warts are caused by a type of germ (virus). Most warts are not painful, and they usually do not cause problems. Sometimes, plantar warts can cause pain when you walk. Warts often go away on their own in time. Treatments may be done if needed. Follow these instructions at home: General instructions  Apply creams or solutions only as told by your doctor. Follow these steps if your doctor tells you to do so: ? Soak your foot in warm water. ? Remove the top layer of softened skin before you apply the medicine. You can use a pumice stone to remove the tissue. ? After you apply the medicine, put a bandage over the area of the wart. ? Repeat the process every day or as told by your doctor.  Do not scratch or pick at a wart.  Wash your hands after you touch a wart.  If a wart is painful, try putting a bandage with a hole in the middle over the wart.  Keep all follow-up visits as told by your doctor. This is important. Prevention  Wear shoes and socks. Change socks every day.  Keep your feet clean and dry.  Check your feet often.  Avoid direct contact with warts on other people. Contact a doctor if:  Your warts do not improve after treatment.  You have redness, swelling, or pain at the site of a wart.  You have bleeding from a wart, and the bleeding does not stop when you put light pressure on the wart.  You have diabetes and you get a wart. This information is not intended to replace advice given to you by your health care provider. Make sure you discuss any questions you have with your health care provider. Document Released: 07/11/2010 Document Revised: 11/14/2015 Document Reviewed: 09/03/2014 Elsevier Interactive Patient Education  [employer].

## 2017-06-10 ENCOUNTER — Ambulatory Visit: Payer: Medicare Other | Admitting: Sports Medicine

## 2017-06-23 ENCOUNTER — Ambulatory Visit: Payer: Medicare Other | Admitting: Sports Medicine

## 2017-06-23 ENCOUNTER — Encounter: Payer: Self-pay | Admitting: Sports Medicine

## 2017-06-23 DIAGNOSIS — B070 Plantar wart: Secondary | ICD-10-CM

## 2017-06-23 DIAGNOSIS — M79672 Pain in left foot: Secondary | ICD-10-CM

## 2017-06-23 NOTE — Progress Notes (Signed)
Subjective: Caroline Dean is a 77 y.o. female patient who returns to office for Left foot wart check. Reports that the area feels much better no longer hurts, states that she did not get a large blister but has been compliant with bandaid and antibiotic cream. Patient denies any other pedal complaints.    Patient Active Problem List   Diagnosis Date Noted  . B12 DEFICIENCY 04/24/2009  . FATTY LIVER DISEASE 05/11/2008  . IRON DEFICIENCY 08/25/2007  . AGITATION 08/15/2007  . INTERNAL HEMORRHOIDS 08/15/2007  . GERD 08/15/2007  . ULCERATIVE COLITIS 08/15/2007  . 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/2007    Current Outpatient Medications on File Prior to Visit  Medication Sig Dispense Refill  . ALPRAZolam (XANAX) 0.25 MG tablet     . amLODipine (NORVASC) 2.5 MG tablet Take 2.5 mg by mouth.    . Cholecalciferol (VITAMIN D-1000 MAX ST) 1000 units tablet Take by mouth.    . ferrous sulfate 325 (65 FE) MG tablet Take 325 mg by mouth daily with breakfast.    . fish oil-omega-3 fatty acids 1000 MG capsule Take 1 g by mouth daily.    . folic acid (FOLVITE) 1 MG tablet Take 1 tablet (1 mg total) by mouth daily. 100 tablet 6  . mercaptopurine (PURINETHOL) 50 MG tablet Take 1 tablet (50 mg total) by mouth daily. Give on an empty stomach 1 hour before or 2 hours after meals. Caution: Chemotherapy. 30 tablet 10  . metoprolol tartrate (LOPRESSOR) 50 MG tablet Take 50 mg by mouth.    Marland Kitchen omeprazole (PRILOSEC) 20 MG capsule TAKE 1 CAPSULE BY MOUTH ONCE DAILY 30 capsule 3  . simvastatin (ZOCOR) 20 MG tablet One tablet by mouth daily     . sulfaSALAzine (AZULFIDINE) 500 MG tablet Take 2 tablets (1,000 mg total) by mouth 2 (two) times daily. 120 tablet 12   No current facility-administered medications on file prior to visit.     Allergies  Allergen Reactions  . Codeine Nausea And Vomiting  . Mesalamine Rash    Objective:  General: Alert and oriented x3 in no acute distress  Dermatology: Keratotic  lesion present measuring <0.5cm at plantar left forefoot x 1 with no skin lines transversing the lesion, resolved pain with medial lateral pressure to the lesion, capillaries with pin point bleeding noted, no webspace macerations, no ecchymosis bilateral, all nails x 10 are well manicured.  Vascular: Dorsalis Pedis and Posterior Tibial pedal pulses 1/4, Capillary Fill Time 3 seconds, + scant pedal hair growth bilateral, trace edema bilateral lower extremities, + brawny pigmentation, Temperature gradient within normal limits.  Neurology: Gross sensation intact via light touch bilateral.  Musculoskeletal: No tenderness with palpation at the lesion site on Left, + Bunion and hammertoe, Muscular strength 5/5 in all groups without pain or limitation on range of motion.  Assessment and Plan: Problem List Items Addressed This Visit    None    Visit Diagnoses    Plantar wart of left foot    -  Primary   Left foot pain         -Complete examination performed -Discussed treatment and long term care options for wart -Parred keratoic warty lesion using a chisel blade; treated the area with Salinocaine covered with bandaid -Will Rx Efudex cream next visit if continues to be problematic  -Patient to return to office in 3-4 weeks for wart check or sooner if condition worsens.  Landis Martins, DPM

## 2017-07-21 ENCOUNTER — Ambulatory Visit: Payer: Medicare Other | Admitting: Sports Medicine

## 2019-04-25 DIAGNOSIS — K449 Diaphragmatic hernia without obstruction or gangrene: Secondary | ICD-10-CM | POA: Insufficient documentation

## 2019-04-25 HISTORY — DX: Diaphragmatic hernia without obstruction or gangrene: K44.9

## 2019-04-28 ENCOUNTER — Ambulatory Visit: Payer: Self-pay | Admitting: General Surgery

## 2019-04-28 NOTE — H&P (Signed)
History of Present Illness Caroline Ok MD; 04/28/2019 10:05 AM) The patient is a 78 year old female who presents with a hiatal hernia. Chief Complaint: Large hiatal hernia  Patient is a 78 year old female with history of ulcerative colitis, hypertension, who comes in secondary to recent ER visit hospitalization for a large hiatal hernia. Patient states that she's recently admitted to the hospital secondary to large hernia and volvulus. Patient on NG tube decompression did well. She was eventually discharged we'll schedule for follow-up. Patient states that in the past she's noticed approximately 3 years of chest pain, dysphagia. She states that this recent recurrence was the worst. Patient has had history of reflux for 10+ years. She is currently on omeprazole. She states that she's had no issues with chest pain and spasm was recently.  Patient has not had a previous upper endoscopy. She sees Dr. [name] in [facility] for her colonoscopies and UC. I currently cannot see the films however we'll have the sent up .   She's had no previous abdominal surgery.    Past Surgical History (Tanisha A. Owens Shark, [place]; 04/28/2019 9:38 AM) Colon Polyp Removal - Colonoscopy   Diagnostic Studies History (Tanisha A. Owens Shark, [place]; 04/28/2019 9:38 AM) Colonoscopy  1-5 years ago  Allergies (Tanisha A. Owens Shark, [place]; 04/28/2019 9:39 AM) Codeine Phosphate *ANALGESICS - OPIOID*  Mesalamine ER *GASTROINTESTINAL AGENTS - MISC.*  Allergies Reconciled   Medication History (Tanisha A. Owens Shark, [place]; 04/28/2019 9:42 AM) amLODIPine Besylate (2.5MG Tablet, Oral) Active. Mercaptopurine (50MG Tablet, Oral) Active. Metoprolol Tartrate (50MG Tablet, Oral) Active. Omeprazole (20MG Capsule DR, Oral) Active. predniSONE (10MG Tablet, Oral) Active. Simvastatin (20MG Tablet, Oral) Active. sulfaSALAzine (500MG Tablet DR, Oral) Active. ALPRAZolam (0.25MG Tablet, Oral) Active. Vitamin D (3000 Oral) Specific  strength unknown - Active. Ferrous Sulfate (325 (65 Fe)MG Tablet, Oral) Active. Fish Oil (Oral) Specific strength unknown - Active. Medications Reconciled  Social History (Tanisha A. Owens Shark, [place]; 04/28/2019 9:38 AM) Alcohol use  Occasional alcohol use. Caffeine use  Coffee. No drug use  Tobacco use  Former smoker.  Family History (Tanisha A. Owens Shark, [place]; 04/28/2019 9:38 AM) Diabetes Mellitus  Mother. Heart Disease  Father, Mother. Hypertension  Mother. Thyroid problems  Mother.  Other Problems (Tanisha A. Owens Shark, [place]; 04/28/2019 9:38 AM) Diverticulosis  Gastroesophageal Reflux Disease  High blood pressure  Hypercholesterolemia  Kidney Stone  Ulcerative Colitis     Review of Systems Caroline Ok MD; 04/28/2019 10:02 AM) General Not Present- Appetite Loss, Chills, Fatigue, Fever, Night Sweats, Weight Gain and Weight Loss. Skin Not Present- Change in Wart/Mole, Dryness, Hives, Jaundice, New Lesions, Non-Healing Wounds, Rash and Ulcer. HEENT Not Present- Earache, Hearing Loss, Hoarseness, Nose Bleed, Oral Ulcers, Ringing in the Ears, Seasonal Allergies, Sinus Pain, Sore Throat, Visual Disturbances, Wears glasses/contact lenses and Yellow Eyes. Respiratory Present- Snoring. Not Present- Bloody sputum, Chronic Cough, Difficulty Breathing and Wheezing. Breast Not Present- Breast Mass, Breast Pain, Nipple Discharge and Skin Changes. Cardiovascular Not Present- Chest Pain, Difficulty Breathing Lying Down, Leg Cramps, Palpitations, Rapid Heart Rate, Shortness of Breath and Swelling of Extremities. Gastrointestinal Not Present- Abdominal Pain, Bloating, Bloody Stool, Change in Bowel Habits, Chronic diarrhea, Constipation, Difficulty Swallowing, Excessive gas, Gets full quickly at meals, Hemorrhoids, Indigestion, Nausea, Rectal Pain and Vomiting. Female Genitourinary Not Present- Frequency, Nocturia, Painful Urination, Pelvic Pain and Urgency. Musculoskeletal Present- Swelling  of Extremities. Not Present- Back Pain, Joint Pain, Joint Stiffness, Muscle Pain and Muscle Weakness. Neurological Not Present- Decreased Memory, Fainting, Headaches, Numbness, Seizures, Tingling, Tremor, Trouble walking and Weakness. Psychiatric  Not Present- Anxiety, Bipolar, Change in Sleep Pattern, Depression, Fearful and Frequent crying. Endocrine Not Present- Cold Intolerance, Excessive Hunger, Hair Changes, Heat Intolerance, Hot flashes and New Diabetes. Hematology Not Present- Blood Thinners, Easy Bruising, Excessive bleeding, Gland problems, HIV and Persistent Infections. All other systems negative  Vitals (Tanisha A. Brown RMA; 04/28/2019 9:39 AM) 04/28/2019 9:38 AM Weight: 198.2 lb Height: 66in Body Surface Area: 1.99 m Body Mass Index: 31.99 kg/m  Temp.: 97.3F  Pulse: 80 (Regular)  BP: 126/82 (Sitting, Left Arm, Standard)       Physical Exam Caroline Ok MD; 04/28/2019 10:05 AM) The physical exam findings are as follows: Note:Constitutional: No acute distress, conversant, appears stated age  Eyes: Anicteric sclerae, moist conjunctiva, no lid lag  Neck: No thyromegaly, trachea midline, no cervical lymphadenopathy  Lungs: Clear to auscultation biilaterally, normal respiratory effot  Cardiovascular: regular rate & rhythm, no murmurs, no peripheal edema, pedal pulses 2+  GI: Soft, no masses or hepatosplenomegaly, non-tender to palpation  MSK: Normal gait, no clubbing cyanosis, edema  Skin: No rashes, palpation reveals normal skin turgor  Psychiatric: Appropriate judgment and insight, oriented to person, place, and time    Assessment & Plan Caroline Ok MD; 04/28/2019 10:06 AM) HIATAL HERNIA (K44.9) Impression: 78 year old female with large hiatal hernia, with what appears to be a large amount of stomach with her chest cavity. Patient will require upper endoscopy, manometry prior to surgery. We will also have her CT scans brought up to visualize  the hiatal hernia and the chest contents. 1. She get excellent candidate for robotic hiatal hernia repair with fundoplication. 2. Discussed with patient the risks and benefits of the procedure to include but not limited to: Infection, bleeding, damage to structures, possible pneumothorax, possible recurrence. The patient voiced understanding and wishes to proceed.

## 2019-05-10 ENCOUNTER — Telehealth: Payer: Self-pay

## 2019-05-10 NOTE — Telephone Encounter (Signed)
Please schedule office visit with any provider MD or APP next available appointment given I never saw her before. Thanks

## 2019-05-10 NOTE — Telephone Encounter (Signed)
Referral from [facility]. Large hiatal hernia with a large amount of the stomach within her chest cavity. Planning robotic hiatal hernia repair with fundoplication. Former Caroline Dean patient who after the retirement of Dr [name], began GI care in [place] with Dr [name]. States Dr [name] is talking of retirement. She is seeking new GI primary. Do you want to see her first or can she schedule directly for EGD and manometry?

## 2019-05-11 ENCOUNTER — Other Ambulatory Visit: Payer: Self-pay

## 2019-05-11 DIAGNOSIS — Z1159 Encounter for screening for other viral diseases: Secondary | ICD-10-CM

## 2019-05-11 NOTE — Telephone Encounter (Signed)
Patient contacted. Scheduled for the manometry and an office visit to establish care. She will need to be scheduled for the EGD at that time.

## 2019-05-17 DIAGNOSIS — R7303 Prediabetes: Secondary | ICD-10-CM | POA: Insufficient documentation

## 2019-05-17 HISTORY — DX: Prediabetes: R73.03

## 2019-05-19 ENCOUNTER — Other Ambulatory Visit (HOSPITAL_COMMUNITY)
Admission: RE | Admit: 2019-05-19 | Discharge: 2019-05-19 | Disposition: A | Discharge status: Home / Self Care | Payer: Medicare Other | Source: Ambulatory Visit | Attending: Gastroenterology | Admitting: Gastroenterology

## 2019-05-19 DIAGNOSIS — Z01812 Encounter for preprocedural laboratory examination: Secondary | ICD-10-CM | POA: Insufficient documentation

## 2019-05-19 DIAGNOSIS — Z20828 Contact with and (suspected) exposure to other viral communicable diseases: Secondary | ICD-10-CM | POA: Insufficient documentation

## 2019-05-19 LAB — SARS CORONAVIRUS 2 (TAT 6-24 HRS): SARS Coronavirus 2: NEGATIVE

## 2019-05-24 ENCOUNTER — Encounter (HOSPITAL_COMMUNITY)
Admission: RE | Disposition: A | Discharge status: Home / Self Care | Payer: Self-pay | Source: Home / Self Care | Attending: Gastroenterology

## 2019-05-24 ENCOUNTER — Ambulatory Visit (HOSPITAL_COMMUNITY)
Admission: RE | Admit: 2019-05-24 | Discharge: 2019-05-24 | Disposition: A | Discharge status: Home / Self Care | Payer: Medicare Other | Attending: Gastroenterology | Admitting: Gastroenterology

## 2019-05-24 DIAGNOSIS — Z538 Procedure and treatment not carried out for other reasons: Secondary | ICD-10-CM | POA: Insufficient documentation

## 2019-05-24 SURGERY — INVASIVE LAB ABORTED CASE

## 2019-05-24 MED ORDER — LIDOCAINE VISCOUS HCL 2 % MT SOLN
OROMUCOSAL | Status: AC
  Filled 2019-05-24: qty 15

## 2019-05-24 SURGICAL SUPPLY — 2 items
FACESHIELD LNG OPTICON STERILE (SAFETY) IMPLANT
GLOVE BIO SURGEON STRL SZ8 (GLOVE) ×8 IMPLANT

## 2019-05-24 NOTE — Progress Notes (Signed)
Esophageal manometry aborted.  Multiple attempts made in different positions and through both right and left nare.  Unable to progress past hiatal hernia.  Patient tolerated well and is scheduled to meet with GI MD next week. Note sent to Dr. Harl Bowie.

## 2019-05-30 ENCOUNTER — Encounter: Payer: Self-pay | Admitting: Gastroenterology

## 2019-05-30 ENCOUNTER — Ambulatory Visit: Payer: Medicare Other | Admitting: Gastroenterology

## 2019-05-30 ENCOUNTER — Other Ambulatory Visit (INDEPENDENT_AMBULATORY_CARE_PROVIDER_SITE_OTHER): Payer: Medicare Other

## 2019-05-30 VITALS — BP 130/88 | HR 64 | Temp 98.3°F | Ht 66.0 in | Wt 194.2 lb

## 2019-05-30 DIAGNOSIS — R1310 Dysphagia, unspecified: Secondary | ICD-10-CM

## 2019-05-30 DIAGNOSIS — K5180 Other ulcerative colitis without complications: Secondary | ICD-10-CM | POA: Diagnosis not present

## 2019-05-30 LAB — VITAMIN B12: Vitamin B-12: 583 pg/mL (ref 211–911)

## 2019-05-30 LAB — FOLATE: Folate: >24.1 ng/mL (ref >5.9)

## 2019-05-30 NOTE — Progress Notes (Signed)
         Caroline Dean    [id number]    04/21/1941  Primary Care Physician:Grisso, Greg A., MD  Referring Physician: Grisso, Greg A., MD [address] [place],  [place] [address]   Chief complaint: Hiatal hernia, ulcerative colitis  HPI: 78-year-old female with history of ulcerative colitis initially diagnosed in 1981, remotely followed by Dr. Brodie is here to reestablish care.  She has been getting care for UC in [place] by Dr. Butler for since Dr. Brodie retired.  She has large hiatal hernia with symptoms of dysphagia and regurgitation.  She saw Dr. Ramirez at [facility] and is planning to undergo hiatal hernia repair. She never had EGD.  Esophageal manometry was unsuccessful due to coiling of the catheter in the hernia sac. She has epigastric abdominal discomfort and intermittent dysphagia. UC symptoms currently under control on 6-MP and sulfasalazine.  Denies any diarrhea, melena, blood in stool, loss of appetite or weight loss. No family history of colon cancer. She is on B12 injections monthly Reviewed recent labs done by PMD on May 17, 2019, showed mild leukopenia elevated MCV 101.  CMP within normal limits. She is up-to-date with vaccinations including flu shot.  Last colonoscopy by Dr. Butler 2 years ago, is not available for review during this visit. She has had multiple surveillance colonoscopies by Dr. Brodie, most recent in November 2013 showed mild colitis 40 to 70 cm and moderately severe colitis in rectum and sigmoid colon.  Outpatient Encounter Medications as of 05/30/2019  Medication Sig  . ALPRAZolam (XANAX) 0.25 MG tablet   . amLODipine (NORVASC) 2.5 MG tablet Take 2.5 mg by mouth daily.   . aspirin 81 MG chewable tablet Chew by mouth daily.  . Cholecalciferol (VITAMIN D-1000 MAX ST) 1000 units tablet Take 3,000 Units by mouth daily.   . Cyanocobalamin 1000 MCG/ML KIT Inject as directed every 30 (thirty) days.  . ferrous sulfate 325 (65 FE) MG tablet Take  325 mg by mouth every other day.   . folic acid (FOLVITE) 1 MG tablet Take 1 tablet (1 mg total) by mouth daily.  . mercaptopurine (PURINETHOL) 50 MG tablet Take 1 tablet (50 mg total) by mouth daily. Give on an empty stomach 1 hour before or 2 hours after meals. Caution: Chemotherapy.  . metoprolol tartrate (LOPRESSOR) 50 MG tablet Take 50 mg by mouth 2 (two) times daily.   . omeprazole (PRILOSEC) 20 MG capsule TAKE 1 CAPSULE BY MOUTH ONCE DAILY  . simvastatin (ZOCOR) 20 MG tablet One tablet by mouth daily   . sulfaSALAzine (AZULFIDINE) 500 MG tablet Take 2 tablets (1,000 mg total) by mouth 2 (two) times daily.  . [DISCONTINUED] fish oil-omega-3 fatty acids 1000 MG capsule Take 1 g by mouth daily.  . [DISCONTINUED] vitamin B-12 (CYANOCOBALAMIN) 1000 MCG tablet Take by mouth every 30 (thirty) days.   No facility-administered encounter medications on file as of 05/30/2019.     Allergies as of 05/30/2019 - Review Complete 05/30/2019  Allergen Reaction Noted  . Codeine Nausea And Vomiting 05/10/2008  . Mesalamine Rash 04/29/2012    Past Medical History:  Diagnosis Date  . Diverticulosis   . Fatty liver   . GERD (gastroesophageal reflux disease)   . Hyperlipemia   . Hyperplastic colon polyp   . Internal hemorrhoids   . Iron deficiency anemia, unspecified   . Osteoarthritis   . Ulcerative colitis   . Vitamin B12 deficiency     Past Surgical History:    Procedure Laterality Date  . COLONOSCOPY    . DILATION AND CURETTAGE OF UTERUS    . TONSILLECTOMY AND ADENOIDECTOMY      Family History  Problem Relation Age of Onset  . Lymphoma Brother   . Diabetes Mother   . Heart disease Father   . Cirrhosis Other        neice with liver transplant  . Colon cancer Neg Hx     Social History   Socioeconomic History  . Marital status: Single    Spouse name: Not on file  . Number of children: 0  . Years of education: Not on file  . Highest education level: Not on file  Occupational  History  . Occupation: retired  Social Needs  . Financial resource strain: Not on file  . Food insecurity    Worry: Not on file    Inability: Not on file  . Transportation needs    Medical: Not on file    Non-medical: Not on file  Tobacco Use  . Smoking status: Never Smoker  . Smokeless tobacco: Never Used  Substance and Sexual Activity  . Alcohol use: Yes    Comment: occasional  . Drug use: No  . Sexual activity: Not on file  Lifestyle  . Physical activity    Days per week: Not on file    Minutes per session: Not on file  . Stress: Not on file  Relationships  . Social connections    Talks on phone: Not on file    Gets together: Not on file    Attends religious service: Not on file    Active member of club or organization: Not on file    Attends meetings of clubs or organizations: Not on file    Relationship status: Not on file  . Intimate partner violence    Fear of current or ex partner: Not on file    Emotionally abused: Not on file    Physically abused: Not on file    Forced sexual activity: Not on file  Other Topics Concern  . Not on file  Social History Narrative  . Not on file      Review of systems: Review of Systems  Constitutional: Negative for fever and chills.  HENT: Negative.   Eyes: Negative for blurred vision.  Respiratory: Negative for cough, shortness of breath and wheezing.   Cardiovascular: Negative for chest pain and palpitations.  Gastrointestinal: as per HPI Genitourinary: Negative for dysuria, urgency, frequency and hematuria.  Musculoskeletal: Negative for myalgias, back pain and joint pain.  Skin: Negative for itching and rash.  Neurological: Negative for dizziness, tremors, focal weakness, seizures and loss of consciousness.  Endo/Heme/Allergies: Positive for seasonal allergies.  Psychiatric/Behavioral: Negative for depression, suicidal ideas and hallucinations.  All other systems reviewed and are negative.   Physical  Exam: Vitals:   05/30/19 1346  BP: 130/88  Pulse: 64  Temp: 98.3 F (36.8 C)   Body mass index is 31.34 kg/m. Gen:      No acute distress HEENT:  EOMI, sclera anicteric Neck:     No masses; no thyromegaly Lungs:    Clear to auscultation bilaterally; normal respiratory effort CV:         Regular rate and rhythm; no murmurs Abd:      + bowel sounds; soft, non-tender; no palpable masses, no distension Ext:    No edema; adequate peripheral perfusion Skin:      Warm and dry; no rash Neuro: alert and oriented   x 3 Psych: normal mood and affect  Data Reviewed:  Reviewed labs, radiology imaging, old records and pertinent past GI work up   Assessment and Plan/Recommendations:  78-year-old with longstanding history of left-sided ulcerative colitis, initially diagnosed in 1981 currently in clinical remission on 6-MP and sulfasalazine, large hiatal hernia with intermittent dysphagia and epigastric abdominal pain  Never had a EGD, will schedule for EGD and also plan for endoscopic placement of esophageal manometry catheter given prior attempt at placement of the catheter was unsuccessful due to coiling The risks and benefits as well as alternatives of endoscopic procedure(s) have been discussed and reviewed. All questions answered. The patient agrees to proceed.  Elevated MCV: Check B12 and folate level  Mild leukopenia likely secondary to 6-MP, will consider to taper it down slowly after colonoscopy if has no evidence of active disease.  Patient wants to hold off scheduling surveillance colonoscopy until after hiatal hernia repair We will obtain records of prior colonoscopies from Dr. Butler  Ulcerative colitis in remission: Continue 6-MP and sulfasalazine along with folic acid Monitor CBC and CMP every 6 months  She is up-to-date with vaccination  Due for bone density scan, plans to schedule it through her primary care doctor  Return in 6 months or sooner if needed  K. Veena  Allysson Rinehimer , MD    CC: Grisso, Greg A., MD   

## 2019-05-30 NOTE — Patient Instructions (Addendum)
Go to the basement for labs today  We will contact you with your Endoscopy and esophageal manometry appointments to be done at [facility]  We will obtain records from Dr [name]  Follow up in 6 months  I appreciate the  opportunity to care for you  Thank You   Harl Bowie , MD

## 2019-05-30 NOTE — H&P (View-Only) (Signed)
Caroline Dean    [id number]    [birth year]  Primary Care Physician:Grisso, Clarita Crane., MD  Referring Physician: [name], MD [place],  [place] [id number]   Chief complaint: Hiatal hernia, ulcerative colitis  HPI: 78 year old female with history of ulcerative colitis initially diagnosed in 1981, remotely followed by Dr. [name] is here to reestablish care.  She has been getting care for UC in [place] by Dr. [name] for since Dr. [name] retired.  She has large hiatal hernia with symptoms of dysphagia and regurgitation.  She saw Dr. Rosendo Gros at [facility] and is planning to undergo hiatal hernia repair. She never had EGD.  Esophageal manometry was unsuccessful due to coiling of the catheter in the hernia sac. She has epigastric abdominal discomfort and intermittent dysphagia. UC symptoms currently under control on 6-MP and sulfasalazine.  Denies any diarrhea, melena, blood in stool, loss of appetite or weight loss. No family history of colon cancer. She is on B12 injections monthly Reviewed recent labs done by PMD on May 17, 2019, showed mild leukopenia elevated MCV 101.  CMP within normal limits. She is up-to-date with vaccinations including flu shot.  Last colonoscopy by Dr. [name] 2 years ago, is not available for review during this visit. She has had multiple surveillance colonoscopies by Dr. [name], most recent in November 2013 showed mild colitis 40 to 70 cm and moderately severe colitis in rectum and sigmoid colon.  Outpatient Encounter Medications as of 05/30/2019  Medication Sig  . ALPRAZolam (XANAX) 0.25 MG tablet   . amLODipine (NORVASC) 2.5 MG tablet Take 2.5 mg by mouth daily.   Marland Kitchen aspirin 81 MG chewable tablet Chew by mouth daily.  . Cholecalciferol (VITAMIN D-1000 MAX ST) 1000 units tablet Take 3,000 Units by mouth daily.   . Cyanocobalamin 1000 MCG/ML KIT Inject as directed every 30 (thirty) days.  . ferrous sulfate 325 (65 FE) MG tablet Take  325 mg by mouth every other day.   . folic acid (FOLVITE) 1 MG tablet Take 1 tablet (1 mg total) by mouth daily.  . mercaptopurine (PURINETHOL) 50 MG tablet Take 1 tablet (50 mg total) by mouth daily. Give on an empty stomach 1 hour before or 2 hours after meals. Caution: Chemotherapy.  . metoprolol tartrate (LOPRESSOR) 50 MG tablet Take 50 mg by mouth 2 (two) times daily.   Marland Kitchen omeprazole (PRILOSEC) 20 MG capsule TAKE 1 CAPSULE BY MOUTH ONCE DAILY  . simvastatin (ZOCOR) 20 MG tablet One tablet by mouth daily   . sulfaSALAzine (AZULFIDINE) 500 MG tablet Take 2 tablets (1,000 mg total) by mouth 2 (two) times daily.  . [DISCONTINUED] fish oil-omega-3 fatty acids 1000 MG capsule Take 1 g by mouth daily.  . [DISCONTINUED] vitamin B-12 (CYANOCOBALAMIN) 1000 MCG tablet Take by mouth every 30 (thirty) days.   No facility-administered encounter medications on file as of 05/30/2019.     Allergies as of 05/30/2019 - Review Complete 05/30/2019  Allergen Reaction Noted  . Codeine Nausea And Vomiting 05/10/2008  . Mesalamine Rash 04/29/2012    Past Medical History:  Diagnosis Date  . Diverticulosis   . Fatty liver   . GERD (gastroesophageal reflux disease)   . Hyperlipemia   . Hyperplastic colon polyp   . Internal hemorrhoids   . Iron deficiency anemia, unspecified   . Osteoarthritis   . Ulcerative colitis   . Vitamin B12 deficiency     Past Surgical History:  Procedure Laterality Date  . COLONOSCOPY    . DILATION AND CURETTAGE OF UTERUS    . TONSILLECTOMY AND ADENOIDECTOMY      Family History  Problem Relation Age of Onset  . Lymphoma Brother   . Diabetes Mother   . Heart disease Father   . Cirrhosis Other        neice with liver transplant  . Colon cancer Neg Hx     Social History   Socioeconomic History  . Marital status: Single    Spouse name: Not on file  . Number of children: 0  . Years of education: Not on file  . Highest education level: Not on file  Occupational  History  . Occupation: retired  Scientific laboratory technician  . Financial resource strain: Not on file  . Food insecurity    Worry: Not on file    Inability: Not on file  . Transportation needs    Medical: Not on file    Non-medical: Not on file  Tobacco Use  . Smoking status: Never Smoker  . Smokeless tobacco: Never Used  Substance and Sexual Activity  . Alcohol use: Yes    Comment: occasional  . Drug use: No  . Sexual activity: Not on file  Lifestyle  . Physical activity    Days per week: Not on file    Minutes per session: Not on file  . Stress: Not on file  Relationships  . Social Herbalist on phone: Not on file    Gets together: Not on file    Attends religious service: Not on file    Active member of club or organization: Not on file    Attends meetings of clubs or organizations: Not on file    Relationship status: Not on file  . Intimate partner violence    Fear of current or ex partner: Not on file    Emotionally abused: Not on file    Physically abused: Not on file    Forced sexual activity: Not on file  Other Topics Concern  . Not on file  Social History Narrative  . Not on file      Review of systems: Review of Systems  Constitutional: Negative for fever and chills.  HENT: Negative.   Eyes: Negative for blurred vision.  Respiratory: Negative for cough, shortness of breath and wheezing.   Cardiovascular: Negative for chest pain and palpitations.  Gastrointestinal: as per HPI Genitourinary: Negative for dysuria, urgency, frequency and hematuria.  Musculoskeletal: Negative for myalgias, back pain and joint pain.  Skin: Negative for itching and rash.  Neurological: Negative for dizziness, tremors, focal weakness, seizures and loss of consciousness.  Endo/Heme/Allergies: Positive for seasonal allergies.  Psychiatric/Behavioral: Negative for depression, suicidal ideas and hallucinations.  All other systems reviewed and are negative.   Physical  Exam: Vitals:   05/30/19 1346  BP: 130/88  Pulse: 64  Temp: 98.3 F (36.8 C)   Body mass index is 31.34 kg/m. Gen:      No acute distress HEENT:  EOMI, sclera anicteric Neck:     No masses; no thyromegaly Lungs:    Clear to auscultation bilaterally; normal respiratory effort CV:         Regular rate and rhythm; no murmurs Abd:      + bowel sounds; soft, non-tender; no palpable masses, no distension Ext:    No edema; adequate peripheral perfusion Skin:      Warm and dry; no rash Neuro: alert and oriented  x 3 Psych: normal mood and affect  Data Reviewed:  Reviewed labs, radiology imaging, old records and pertinent past GI work up   Assessment and Plan/Recommendations:  78 year old with longstanding history of left-sided ulcerative colitis, initially diagnosed in 1981 currently in clinical remission on 6-MP and sulfasalazine, large hiatal hernia with intermittent dysphagia and epigastric abdominal pain  Never had a EGD, will schedule for EGD and also plan for endoscopic placement of esophageal manometry catheter given prior attempt at placement of the catheter was unsuccessful due to coiling The risks and benefits as well as alternatives of endoscopic procedure(s) have been discussed and reviewed. All questions answered. The patient agrees to proceed.  Elevated MCV: Check B12 and folate level  Mild leukopenia likely secondary to 6-MP, will consider to taper it down slowly after colonoscopy if has no evidence of active disease.  Patient wants to hold off scheduling surveillance colonoscopy until after hiatal hernia repair We will obtain records of prior colonoscopies from Dr. [name]  Ulcerative colitis in remission: Continue 6-MP and sulfasalazine along with folic acid Monitor CBC and CMP every 6 months  She is up-to-date with vaccination  Due for bone density scan, plans to schedule it through her primary care doctor  Return in 6 months or sooner if needed  K. Denzil Magnuson , MD    CC: [name], MD

## 2019-06-01 ENCOUNTER — Other Ambulatory Visit: Payer: Self-pay | Admitting: *Deleted

## 2019-06-01 ENCOUNTER — Telehealth: Payer: Self-pay | Admitting: *Deleted

## 2019-06-01 DIAGNOSIS — R1310 Dysphagia, unspecified: Secondary | ICD-10-CM

## 2019-06-01 NOTE — Telephone Encounter (Signed)
Called patient and gave her date and time of EGD and Mano at [facility]   12/30 at 12:30pm   Will mail instructions and get COVID test scheduled and mail to patient

## 2019-06-02 ENCOUNTER — Telehealth: Payer: Self-pay | Admitting: *Deleted

## 2019-06-02 NOTE — Telephone Encounter (Signed)
Called received long Endo unit, spoke to [employer].  She is trying to work on getting the patient scheduled for December 28 if they are able to get an Endo nurse for manometry study.  She will call back to confirm.  Thank you

## 2019-06-02 NOTE — Telephone Encounter (Signed)
Caroline Dean called back to confirm patient will have EGD at 9:45am then the [facility] at 11am on 06/19/2019   Called patient to inform her of the changes and that her COVID test would remain the same on  Sat  12/26  Ok per Caroline Dean and Dr Silverio Decamp for patient to have COVID test on Sat 12/26

## 2019-06-02 NOTE — Telephone Encounter (Signed)
Dr Breck Coons from ENDO called and said they can not do patients EGD and Mano on the same day she will have to be rescheduled  They (Schedulers) scheduled the EGD and Mano in a [place] room and Mansouraty has that day,    What other day do you want to look at for her

## 2019-06-12 ENCOUNTER — Encounter (HOSPITAL_COMMUNITY): Payer: Self-pay | Admitting: Gastroenterology

## 2019-06-16 ENCOUNTER — Encounter (HOSPITAL_COMMUNITY): Payer: Self-pay | Admitting: Gastroenterology

## 2019-06-16 NOTE — Anesthesia Preprocedure Evaluation (Addendum)
Anesthesia Evaluation  Patient identified by MRN, date of birth, ID band Patient awake    Reviewed: Allergy & Precautions, NPO status , Patient's Chart, lab work & pertinent test results  Airway Mallampati: II  TM Distance: >3 FB Neck ROM: Full    Dental no notable dental hx. (+) Dental Advisory Given   Pulmonary neg pulmonary ROS,    Pulmonary exam normal breath sounds clear to auscultation       Cardiovascular hypertension, Pt. on medications negative cardio ROS Normal cardiovascular exam Rhythm:Regular Rate:Normal     Neuro/Psych negative neurological ROS  negative psych ROS   GI/Hepatic negative GI ROS, Neg liver ROS, PUD, GERD  Medicated and Controlled,Hx/o Fatty liver Ulcerative colitis Diverticulosis Dysphagia    Endo/Other  negative endocrine ROSHyperlipidemia   Renal/GU negative Renal ROS     Musculoskeletal negative musculoskeletal ROS (+) Arthritis , Osteoarthritis,    Abdominal   Peds negative pediatric ROS (+)  Hematology negative hematology ROS (+) anemia , Fe deficiency anemia   Anesthesia Other Findings   Reproductive/Obstetrics negative OB ROS                            Anesthesia Physical Anesthesia Plan  ASA: II  Anesthesia Plan: MAC   Post-op Pain Management:    Induction: Intravenous  PONV Risk Score and Plan: 2 and Propofol infusion, Ondansetron and Treatment may vary due to age or medical condition  Airway Management Planned: Natural Airway and Nasal Cannula  Additional Equipment:   Intra-op Plan:   Post-operative Plan:   Informed Consent: I have reviewed the patients History and Physical, chart, labs and discussed the procedure including the risks, benefits and alternatives for the proposed anesthesia with the patient or authorized representative who has indicated his/her understanding and acceptance.     Dental advisory given  Plan  Discussed with: CRNA  Anesthesia Plan Comments:        Anesthesia Quick Evaluation

## 2019-06-17 ENCOUNTER — Other Ambulatory Visit (HOSPITAL_COMMUNITY)
Admission: RE | Admit: 2019-06-17 | Discharge: 2019-06-17 | Disposition: A | Discharge status: Home / Self Care | Payer: Medicare Other | Source: Ambulatory Visit | Attending: Gastroenterology | Admitting: Gastroenterology

## 2019-06-17 ENCOUNTER — Encounter (HOSPITAL_COMMUNITY): Payer: Self-pay | Admitting: Gastroenterology

## 2019-06-17 DIAGNOSIS — Z20828 Contact with and (suspected) exposure to other viral communicable diseases: Secondary | ICD-10-CM | POA: Diagnosis not present

## 2019-06-17 DIAGNOSIS — Z01812 Encounter for preprocedural laboratory examination: Secondary | ICD-10-CM | POA: Insufficient documentation

## 2019-06-17 LAB — SARS CORONAVIRUS 2 (TAT 6-24 HRS): SARS Coronavirus 2: NEGATIVE

## 2019-06-19 ENCOUNTER — Ambulatory Visit (HOSPITAL_BASED_OUTPATIENT_CLINIC_OR_DEPARTMENT_OTHER)
Admission: RE | Admit: 2019-06-19 | Discharge: 2019-06-19 | Disposition: A | Discharge status: Home / Self Care | Payer: Medicare Other | Source: Ambulatory Visit | Attending: Gastroenterology | Admitting: Gastroenterology

## 2019-06-19 ENCOUNTER — Ambulatory Visit (HOSPITAL_COMMUNITY): Payer: Medicare Other | Admitting: Anesthesiology

## 2019-06-19 ENCOUNTER — Encounter (HOSPITAL_COMMUNITY)
Admission: RE | Disposition: A | Discharge status: Home / Self Care | Payer: Self-pay | Source: Ambulatory Visit | Attending: Gastroenterology

## 2019-06-19 ENCOUNTER — Encounter (HOSPITAL_COMMUNITY): Payer: Self-pay | Admitting: Gastroenterology

## 2019-06-19 ENCOUNTER — Ambulatory Visit (HOSPITAL_COMMUNITY)
Admission: RE | Admit: 2019-06-19 | Discharge: 2019-06-19 | Disposition: A | Discharge status: Still In Hospital | Payer: Medicare Other | Attending: Gastroenterology | Admitting: Gastroenterology

## 2019-06-19 ENCOUNTER — Other Ambulatory Visit: Payer: Self-pay

## 2019-06-19 ENCOUNTER — Encounter (HOSPITAL_COMMUNITY)
Admission: RE | Disposition: A | Discharge status: Still In Hospital | Payer: Self-pay | Source: Home / Self Care | Attending: Gastroenterology

## 2019-06-19 DIAGNOSIS — E538 Deficiency of other specified B group vitamins: Secondary | ICD-10-CM | POA: Diagnosis not present

## 2019-06-19 DIAGNOSIS — E785 Hyperlipidemia, unspecified: Secondary | ICD-10-CM | POA: Diagnosis not present

## 2019-06-19 DIAGNOSIS — Z79899 Other long term (current) drug therapy: Secondary | ICD-10-CM | POA: Insufficient documentation

## 2019-06-19 DIAGNOSIS — K2100 Gastro-esophageal reflux disease with esophagitis, without bleeding: Secondary | ICD-10-CM | POA: Diagnosis not present

## 2019-06-19 DIAGNOSIS — M1990 Unspecified osteoarthritis, unspecified site: Secondary | ICD-10-CM | POA: Diagnosis not present

## 2019-06-19 DIAGNOSIS — Z7982 Long term (current) use of aspirin: Secondary | ICD-10-CM | POA: Insufficient documentation

## 2019-06-19 DIAGNOSIS — R1310 Dysphagia, unspecified: Secondary | ICD-10-CM

## 2019-06-19 DIAGNOSIS — K449 Diaphragmatic hernia without obstruction or gangrene: Secondary | ICD-10-CM | POA: Insufficient documentation

## 2019-06-19 DIAGNOSIS — K3189 Other diseases of stomach and duodenum: Secondary | ICD-10-CM | POA: Insufficient documentation

## 2019-06-19 DIAGNOSIS — K219 Gastro-esophageal reflux disease without esophagitis: Secondary | ICD-10-CM | POA: Diagnosis not present

## 2019-06-19 DIAGNOSIS — I10 Essential (primary) hypertension: Secondary | ICD-10-CM | POA: Diagnosis not present

## 2019-06-19 DIAGNOSIS — K760 Fatty (change of) liver, not elsewhere classified: Secondary | ICD-10-CM | POA: Insufficient documentation

## 2019-06-19 DIAGNOSIS — K2950 Unspecified chronic gastritis without bleeding: Secondary | ICD-10-CM | POA: Diagnosis not present

## 2019-06-19 DIAGNOSIS — Z1159 Encounter for screening for other viral diseases: Secondary | ICD-10-CM

## 2019-06-19 HISTORY — PX: BIOPSY: SHX5522

## 2019-06-19 HISTORY — PX: ESOPHAGOGASTRODUODENOSCOPY (EGD) WITH PROPOFOL: SHX5813

## 2019-06-19 HISTORY — PX: ESOPHAGEAL MANOMETRY: SHX5429

## 2019-06-19 HISTORY — DX: Essential (primary) hypertension: I10

## 2019-06-19 SURGERY — ESOPHAGOGASTRODUODENOSCOPY (EGD) WITH PROPOFOL
Anesthesia: Monitor Anesthesia Care

## 2019-06-19 SURGERY — MANOMETRY, ESOPHAGUS

## 2019-06-19 MED ORDER — EPHEDRINE SULFATE 50 MG/ML IJ SOLN
INTRAMUSCULAR | Status: DC | PRN
  Administered 2019-06-19: 5 mg via INTRAVENOUS

## 2019-06-19 MED ORDER — PROPOFOL 500 MG/50ML IV EMUL
INTRAVENOUS | Status: AC
  Filled 2019-06-19: qty 50

## 2019-06-19 MED ORDER — LIDOCAINE HCL (CARDIAC) PF 100 MG/5ML IV SOSY
PREFILLED_SYRINGE | INTRAVENOUS | Status: DC | PRN
  Administered 2019-06-19: 100 mg via INTRATRACHEAL

## 2019-06-19 MED ORDER — OMEPRAZOLE 20 MG PO CPDR: 20.0000 mg | DELAYED_RELEASE_CAPSULE | Freq: Two times a day (BID) | ORAL | 3 refills | Status: DC

## 2019-06-19 MED ORDER — SODIUM CHLORIDE 0.9 % IV SOLN: INTRAVENOUS | Status: DC

## 2019-06-19 MED ORDER — PROPOFOL 500 MG/50ML IV EMUL
INTRAVENOUS | Status: DC | PRN
  Administered 2019-06-19: 50 mg via INTRAVENOUS
  Administered 2019-06-19: 125 ug/kg/min via INTRAVENOUS

## 2019-06-19 MED ORDER — LACTATED RINGERS IV SOLN
INTRAVENOUS | Status: AC | PRN
  Administered 2019-06-19: 1000 mL via INTRAVENOUS

## 2019-06-19 MED ORDER — ONDANSETRON HCL 4 MG/2ML IJ SOLN
INTRAMUSCULAR | Status: DC | PRN
  Administered 2019-06-19: 4 mg via INTRAVENOUS

## 2019-06-19 MED ORDER — PROPOFOL 500 MG/50ML IV EMUL
INTRAVENOUS | Status: DC | PRN
  Administered 2019-06-19: 125 ug/kg/min via INTRAVENOUS
  Administered 2019-06-19: 30 mg via INTRAVENOUS

## 2019-06-19 MED ORDER — GLYCOPYRROLATE 0.2 MG/ML IJ SOLN
INTRAMUSCULAR | Status: DC | PRN
  Administered 2019-06-19: .2 mg via INTRAVENOUS

## 2019-06-19 SURGICAL SUPPLY — 14 items

## 2019-06-19 SURGICAL SUPPLY — 2 items
FACESHIELD LNG OPTICON STERILE (SAFETY) IMPLANT
GLOVE BIO SURGEON STRL SZ8 (GLOVE) ×6 IMPLANT

## 2019-06-19 NOTE — Op Note (Signed)
[facility] Patient Name: Caroline Dean Procedure Date: 06/19/2019 MRN: [id number] Attending MD: Mauri Pole , MD Date of Birth: 02-Sep-1940 CSN: [id number] Age: 78 Admit Type: Outpatient Procedure:                Upper GI endoscopy Indications:              Esophageal reflux, Hiatal hernia, pre op hernia                            repair Providers:                Mauri Pole, MD, Carmie End, RN, Burtis Junes, RN, Lazaro Arms, Technician Referring MD:              Medicines:                Monitored Anesthesia Care Complications:            No immediate complications. Estimated Blood Loss:     Estimated blood loss was minimal. Procedure:                Pre-Anesthesia Assessment:                           - Prior to the procedure, a History and Physical                            was performed, and patient medications and                            allergies were reviewed. The patient's tolerance of                            previous anesthesia was also reviewed. The risks                            and benefits of the procedure and the sedation                            options and risks were discussed with the patient.                            All questions were answered, and informed consent                            was obtained. Prior Anticoagulants: The patient has                            taken no previous anticoagulant or antiplatelet                            agents. ASA Grade Assessment: II - A patient with  mild systemic disease. After reviewing the risks                            and benefits, the patient was deemed in                            satisfactory condition to undergo the procedure.                           After obtaining informed consent, the endoscope was                            passed under direct vision. Throughout the                            procedure,  the patient's blood pressure, pulse, and                            oxygen saturations were monitored continuously. The                            GIF-H190 ([id number]) Olympus gastroscope was                            introduced through the mouth, and advanced to the                            second part of duodenum. The upper GI endoscopy was                            technically difficult and complex due to abnormal                            anatomy. The patient tolerated the procedure well. Scope In: Scope Out: Findings:      No gross lesions were noted in the entire esophagus.      The Z-line was regular and was found 36 cm from the incisors.      A large hiatal hernia , para esophageal was present. A nasogastric tube       (manometry probe/catheter) was placed through the nares into the       esophagus. Under endoscopic guidance, the probe was advanced into the       stomach. Placement was confirmed by scope visualization.      Patchy moderately friable mucosa with contact bleeding was found in the       cardia, hiatus of hernia. Biopsies were taken with a cold forceps for       histology.      The cardia and gastric fundus were normal on retroflexion.      The examined duodenum was normal. Impression:               - Z-line regular, 36 cm from the incisors.                           - Large hiatal hernia.                           -  Friable gastric cardia mucosa. Biopsied.                           - Normal examined duodenum.                           - Manometry catheter placement was successfully                            performed. Moderate Sedation:      Not Applicable - Patient had care per Anesthesia. Recommendation:           - Patient has a contact number available for                            emergencies. The signs and symptoms of potential                            delayed complications were discussed with the                            patient. Return to normal  activities tomorrow.                            Written discharge instructions were provided to the                            patient.                           - Resume previous diet.                           - Continue present medications.                           - Await pathology results.                           - Manometry study will be a seaprate report, please                            refer to it for additional details and                            recommendations Procedure Code(s):        --- Professional ---                           [phone], Esophagogastroduodenoscopy, flexible,                            transoral; with insertion of intraluminal tube or                            catheter  20813, Esophagogastroduodenoscopy, flexible,                            transoral; with biopsy, single or multiple Diagnosis Code(s):        --- Professional ---                           K44.9, Diaphragmatic hernia without obstruction or                            gangrene                           K31.89, Other diseases of stomach and duodenum                           K21.9, Gastro-esophageal reflux disease without                            esophagitis CPT copyright 2019 American Medical Association. All rights reserved. The codes documented in this report are preliminary and upon coder review may  be revised to meet current compliance requirements. Mauri Pole, MD 06/19/2019 9:58:15 AM This report has been signed electronically. Number of Addenda: 0

## 2019-06-19 NOTE — Anesthesia Postprocedure Evaluation (Signed)
Anesthesia Post Note  Patient: Caroline Dean  Procedure(s) Performed: ESOPHAGOGASTRODUODENOSCOPY (EGD) WITH PROPOFOL (N/A ) BIOPSY     Patient location during evaluation: PACU Anesthesia Type: MAC Level of consciousness: awake and alert Pain management: pain level controlled Vital Signs Assessment: post-procedure vital signs reviewed and stable Respiratory status: spontaneous breathing Cardiovascular status: stable Anesthetic complications: no    Last Vitals:  Vitals:   06/19/19 1020 06/19/19 1030  BP: (!) 144/69 (!) 155/76  Pulse: 65 63  Resp: 17 17  Temp:    SpO2: 97% 94%    Last Pain:  Vitals:   06/19/19 1030  TempSrc:   PainSc: 0-No pain                 Nolon Nations

## 2019-06-19 NOTE — Interval H&P Note (Signed)
History and Physical Interval Note:  06/19/2019 8:22 AM  Caroline Dean  has presented today for surgery, with the diagnosis of dysphagia.  The various methods of treatment have been discussed with the patient and family. After consideration of risks, benefits and other options for treatment, the patient has consented to  Procedure(s) with comments: ESOPHAGOGASTRODUODENOSCOPY (EGD) WITH PROPOFOL (N/A) - with manometry probe placement as a surgical intervention.  The patient's history has been reviewed, patient examined, no change in status, stable for surgery.  I have reviewed the patient's chart and labs.  Questions were answered to the patient's satisfaction.     Kesler Wickham

## 2019-06-19 NOTE — Progress Notes (Signed)
High resolution esophageal manometry probe placed during EGD by Dr. Harl Bowie.  Patient was taken to recovery and allowed to wake up.  Patient was then moved to manometry room.  Manometry probe was pulled back to 47 cm at left nare and secured in place.  Esophageal manometry was then performed per protocol.  Probe was removed.  Patient tolerated well.  Dishcharged home.  Report sent to Dr Silverio Decamp.

## 2019-06-19 NOTE — Transfer of Care (Signed)
Immediate Anesthesia Transfer of Care Note  Patient: Caroline Dean  Procedure(s) Performed: Procedure(s) with comments: ESOPHAGOGASTRODUODENOSCOPY (EGD) WITH PROPOFOL (N/A) - with manometry probe placement  Patient Location: PACU  Anesthesia Type:MAC  Level of Consciousness:  sedated, patient cooperative and responds to stimulation  Airway & Oxygen Therapy:Patient Spontanous Breathing and Patient connected to face mask oxgen  Post-op Assessment:  Report given to PACU RN and Post -op Vital signs reviewed and stable  Post vital signs:  Reviewed and stable  Last Vitals:  Vitals:   06/19/19 0826 06/19/19 0956  BP: (!) 163/69 112/62  Pulse: 60 85  Resp: 15 17  Temp: 36.9 C 36.5 C  SpO2: 70% 48%    Complications: No apparent anesthesia complications

## 2019-06-19 NOTE — Discharge Instructions (Signed)
YOU HAD AN ENDOSCOPIC PROCEDURE TODAY: Refer to the procedure report and other information in the discharge instructions given to you for any specific questions about what was found during the examination. If this information does not answer your questions, please call [place] office at [phone] to clarify.   YOU SHOULD EXPECT: Some feelings of bloating in the abdomen. Passage of more gas than usual. Walking can help get rid of the air that was put into your GI tract during the procedure and reduce the bloating. If you had a lower endoscopy (such as a colonoscopy or flexible sigmoidoscopy) you may notice spotting of blood in your stool or on the toilet paper. Some abdominal soreness may be present for a day or two, also.  DIET: Your first meal following the procedure should be a light meal and then it is ok to progress to your normal diet. A half-sandwich or bowl of soup is an example of a good first meal. Heavy or fried foods are harder to digest and may make you feel nauseous or bloated. Drink plenty of fluids but you should avoid alcoholic beverages for 24 hours. If you had a esophageal dilation, please see attached instructions for diet.    ACTIVITY: Your care partner should take you home directly after the procedure. You should plan to take it easy, moving slowly for the rest of the day. You can resume normal activity the day after the procedure however YOU SHOULD NOT DRIVE, use power tools, machinery or perform tasks that involve climbing or major physical exertion for 24 hours (because of the sedation medicines used during the test).   SYMPTOMS TO REPORT IMMEDIATELY: A gastroenterologist can be reached at any hour. Please call [phone]  for any of the following symptoms:   Following upper endoscopy (EGD, EUS, ERCP, esophageal dilation) Vomiting of blood or coffee ground material  New, significant abdominal pain  New, significant chest pain or pain under the shoulder blades  Painful or  persistently difficult swallowing  New shortness of breath  Black, tarry-looking or red, bloody stools  FOLLOW UP:  If any biopsies were taken you will be contacted by phone or by letter within the next 1-3 weeks. Call [phone]  if you have not heard about the biopsies in 3 weeks.  Please also call with any specific questions about appointments or follow up tests.  

## 2019-06-20 ENCOUNTER — Other Ambulatory Visit: Payer: Self-pay

## 2019-06-20 LAB — SURGICAL PATHOLOGY

## 2019-06-21 ENCOUNTER — Encounter: Payer: Self-pay | Admitting: *Deleted

## 2019-06-21 ENCOUNTER — Encounter (HOSPITAL_COMMUNITY): Payer: Self-pay

## 2019-06-21 ENCOUNTER — Ambulatory Visit (HOSPITAL_COMMUNITY): Admit: 2019-06-21 | Payer: Medicare Other | Admitting: Gastroenterology

## 2019-06-21 SURGERY — ESOPHAGOGASTRODUODENOSCOPY (EGD) WITH PROPOFOL
Anesthesia: Monitor Anesthesia Care

## 2019-07-10 NOTE — Patient Instructions (Signed)
DUE TO COVID-19 ONLY ONE VISITOR IS ALLOWED TO COME WITH YOU AND STAY IN THE WAITING ROOM ONLY DURING PRE OP AND PROCEDURE DAY OF SURGERY. THE 1 VISITOR MAY VISIT WITH YOU AFTER SURGERY IN YOUR PRIVATE ROOM DURING VISITING HOURS ONLY!  YOU NEED TO HAVE A COVID 19 TEST ON_1/19______ @_1 :50 PM______, THIS TEST MUST BE DONE BEFORE SURGERY, COME  [address], [place] , [address].  ([place]) ONCE YOUR COVID TEST IS COMPLETED, PLEASE BEGIN THE QUARANTINE INSTRUCTIONS AS OUTLINED IN YOUR HANDOUT.                Caroline Dean    Your procedure is scheduled on: 07/14/19   Report to [facility] Main  Entrance   Report to admitting at  10:00 AM     Call this number if you have problems the morning of surgery [place], NO CHEWING GUM [place].   Do not eat food After Midnight.   YOU MAY HAVE CLEAR LIQUIDS FROM MIDNIGHT UNTIL 10:00 AM.    CLEAR LIQUID DIET   Foods Allowed                                                                     Foods Excluded  Coffee and tea, regular and decaf                             liquids that you cannot  Plain Jell-O any favor except red or purple                                           see through such as: Fruit ices (not with fruit pulp)                                     milk, soups, orange juice  Iced Popsicles                                    All solid food Carbonated beverages, regular and diet                                    Cranberry, grape and apple juices Sports drinks like Gatorade Lightly seasoned clear broth or consume(fat free) Sugar, honey syrup       At 10:00 AM Please finish the prescribed Pre-Surgery  drink.   Nothing by mouth after you finish the drink !   Take these medicines the morning of surgery with A SIP OF WATER: Metoprolol, (Lopressor), Amlodipine (Norvasc)                                 You may  not have any metal on  your body including hair pins and              piercings  Do not wear jewelry, make-up, lotions, powders or perfumes, deodorant             Do not wear nail polish on your fingernails.  Do not shave  48 hours prior to surgery.     Do not bring valuables to the hospital. [place].  Contacts, dentures or bridgework may not be worn into surgery.       Patients discharged the day of surgery will not be allowed to drive home.   IF YOU ARE HAVING SURGERY AND GOING HOME THE SAME DAY, YOU MUST HAVE AN ADULT TO DRIVE YOU HOME AND BE WITH YOU FOR 24 HOURS.   YOU MAY GO HOME BY TAXI OR UBER OR ORTHERWISE, BUT AN ADULT MUST ACCOMPANY YOU HOME AND STAY WITH YOU FOR 24 HOURS.  Name and phone number of your driver:  Special Instructions: N/A              Please read over the following fact sheets you were given: _____________________________________________________________________             [facility] - Preparing for Surgery  Before surgery, you can play an important role .  Because skin is not sterile, your skin needs to be as free of germs as possible.   You can reduce the number of germs on your skin by washing with CHG (chlorahexidine gluconate) soap before surgery.   CHG is an antiseptic cleaner which kills germs and bonds with the skin to continue killing germs even after washing. Please DO NOT use if you have an allergy to CHG or antibacterial soaps.   If your skin becomes reddened/irritated stop using the CHG and inform your nurse when you arrive at Short Stay. Do not shave (including legs and underarms) for at least 48 hours prior to the first CHG shower.  Please follow these instructions carefully:  1.  Shower with CHG Soap the night before surgery and the  morning of Surgery.  2.  If you choose to wash your hair, wash your hair first as usual with your  normal  shampoo.  3.  After you shampoo, rinse your hair  and body thoroughly to remove the  shampoo.                                        4.  Use CHG as you would any other liquid soap.  You can apply chg directly  to the skin and wash                       Gently with a scrungie or clean washcloth.  5.  Apply the CHG Soap to your body ONLY FROM THE NECK DOWN.   Do not use on face/ open                           Wound or open sores. Avoid contact with eyes, ears mouth and genitals (private parts).                       Wash face,  Development worker, international aid (private parts)  with your normal soap.             6.  Wash thoroughly, paying special attention to the area where your surgery  will be performed.  7.  Thoroughly rinse your body with warm water from the neck down.  8.  DO NOT shower/wash with your normal soap after using and rinsing off  the CHG Soap.             9.  Pat yourself dry with a clean towel.            10.  Wear clean pajamas.            11.  Place clean sheets on your bed the night of your first shower and do not  sleep with pets. Day of Surgery : Do not apply any lotions/deodorants the morning of surgery.  Please wear clean clothes to the hospital/surgery center.  FAILURE TO FOLLOW THESE INSTRUCTIONS MAY RESULT IN THE CANCELLATION OF YOUR SURGERY PATIENT SIGNATURE_________________________________  NURSE SIGNATURE__________________________________  ________________________________________________________________________   Caroline Dean  An incentive spirometer is a tool that can help keep your lungs clear and active. This tool measures how well you are filling your lungs with each breath. Taking long deep breaths may help reverse or decrease the chance of developing breathing (pulmonary) problems (especially infection) following:  A long period of time when you are unable to move or be active. BEFORE THE PROCEDURE   If the spirometer includes an indicator to show your best effort, your nurse or respiratory therapist will set it to a  desired goal.  If possible, sit up straight or lean slightly forward. Try not to slouch.  Hold the incentive spirometer in an upright position. INSTRUCTIONS FOR USE  1. Sit on the edge of your bed if possible, or sit up as far as you can in bed or on a chair. 2. Hold the incentive spirometer in an upright position. 3. Breathe out normally. 4. Place the mouthpiece in your mouth and seal your lips tightly around it. 5. Breathe in slowly and as deeply as possible, raising the piston or the ball toward the top of the column. 6. Hold your breath for 3-5 seconds or for as long as possible. Allow the piston or ball to fall to the bottom of the column. 7. Remove the mouthpiece from your mouth and breathe out normally. 8. Rest for a few seconds and repeat Steps 1 through 7 at least 10 times every 1-2 hours when you are awake. Take your time and take a few normal breaths between deep breaths. 9. The spirometer may include an indicator to show your best effort. Use the indicator as a goal to work toward during each repetition. 10. After each set of 10 deep breaths, practice coughing to be sure your lungs are clear. If you have an incision (the cut made at the time of surgery), support your incision when coughing by placing a pillow or rolled up towels firmly against it. Once you are able to get out of bed, walk around indoors and cough well. You may stop using the incentive spirometer when instructed by your caregiver.  RISKS AND COMPLICATIONS  Take your time so you do not get dizzy or light-headed.  If you are in pain, you may need to take or ask for pain medication before doing incentive spirometry. It is harder to take a deep breath if you are having pain. AFTER USE  Rest and breathe slowly and easily.  It can be helpful to keep track of a log of your progress. Your caregiver can provide you with a simple table to help with this. If you are using the spirometer at home, follow these  instructions: [place] IF:   You are having difficultly using the spirometer.  You have trouble using the spirometer as often as instructed.  Your pain medication is not giving enough relief while using the spirometer.  You develop fever of 100.5 F (38.1 C) or higher. SEEK IMMEDIATE MEDICAL CARE IF:   You cough up bloody sputum that had not been present before.  You develop fever of 102 F (38.9 C) or greater.  You develop worsening pain at or near the incision site. MAKE SURE YOU:   Understand these instructions.  Will watch your condition.  Will get help right away if you are not doing well or get worse. Document Released: 10/19/2006 Document Revised: 08/31/2011 Document Reviewed: 12/20/2006 [facility] Patient Information 2014 [place], [place].   ________________________________________________________________________

## 2019-07-11 ENCOUNTER — Other Ambulatory Visit: Payer: Self-pay

## 2019-07-11 ENCOUNTER — Other Ambulatory Visit (HOSPITAL_COMMUNITY)
Admission: RE | Admit: 2019-07-11 | Discharge: 2019-07-11 | Disposition: A | Discharge status: Home / Self Care | Payer: Medicare Other | Source: Ambulatory Visit | Attending: General Surgery | Admitting: General Surgery

## 2019-07-11 ENCOUNTER — Other Ambulatory Visit (HOSPITAL_COMMUNITY): Payer: Medicare Other

## 2019-07-11 ENCOUNTER — Encounter (HOSPITAL_COMMUNITY)
Admission: RE | Admit: 2019-07-11 | Discharge: 2019-07-11 | Disposition: A | Discharge status: Home / Self Care | Payer: Medicare Other | Source: Ambulatory Visit | Attending: General Surgery | Admitting: General Surgery

## 2019-07-11 ENCOUNTER — Encounter (HOSPITAL_COMMUNITY): Payer: Self-pay

## 2019-07-11 DIAGNOSIS — R001 Bradycardia, unspecified: Secondary | ICD-10-CM | POA: Insufficient documentation

## 2019-07-11 DIAGNOSIS — R9431 Abnormal electrocardiogram [ECG] [EKG]: Secondary | ICD-10-CM | POA: Insufficient documentation

## 2019-07-11 DIAGNOSIS — Z20822 Contact with and (suspected) exposure to covid-19: Secondary | ICD-10-CM | POA: Insufficient documentation

## 2019-07-11 DIAGNOSIS — Z01818 Encounter for other preprocedural examination: Secondary | ICD-10-CM | POA: Insufficient documentation

## 2019-07-11 LAB — BASIC METABOLIC PANEL
Anion gap: 11 (ref 5–15)
BUN: 16 mg/dL (ref 8–23)
CO2: 21 mmol/L — ABNORMAL LOW (ref 22–32)
Calcium: 9 mg/dL (ref 8.9–10.3)
Chloride: 109 mmol/L (ref 98–111)
Creatinine, Ser: 0.74 mg/dL (ref 0.44–1.00)
GFR calc Af Amer: >60 mL/min (ref >60)
GFR calc non Af Amer: >60 mL/min (ref >60)
Glucose, Bld: 89 mg/dL (ref 70–99)
Potassium: 3.8 mmol/L (ref 3.5–5.1)
Sodium: 141 mmol/L (ref 135–145)

## 2019-07-11 LAB — CBC
HCT: 39.8 % (ref 36.0–46.0)
Hemoglobin: 12.4 g/dL (ref 12.0–15.0)
MCH: 32.4 pg (ref 26.0–34.0)
MCHC: 31.2 g/dL (ref 30.0–36.0)
MCV: 103.9 fL — ABNORMAL HIGH (ref 80.0–100.0)
Platelets: 232 10*3/uL (ref 150–400)
RBC: 3.83 MIL/uL — ABNORMAL LOW (ref 3.87–5.11)
RDW: 13.7 % (ref 11.5–15.5)
WBC: 4 10*3/uL (ref 4.0–10.5)
nRBC: 0 % (ref 0.0–0.2)

## 2019-07-11 MED ORDER — FOLIC ACID 1 MG PO TABS: 1.0000 mg | ORAL_TABLET | Freq: Every day | ORAL | 6 refills | Status: DC

## 2019-07-11 NOTE — Progress Notes (Signed)
PCP - [place] NP Cardiologist - NA  Chest x-ray - NA EKG - 07/11/2019 Stress Test - NA ECHO - NA Cardiac Cath - NA  Sleep Study - NA CPAP -   Fasting Blood Sugar - NA Checks Blood Sugar _____ times a day  Blood Thinner Instructions: Aspirin Instructions: 57m Last Dose: 07/11/2019  Anesthesia review:   Patient denies shortness of breath, fever, cough and chest pain at PAT appointment   Patient verbalized understanding of instructions that were given to them at the PAT appointment. Patient was also instructed that they will need to review over the PAT instructions again at home before surgery.

## 2019-07-12 LAB — NOVEL CORONAVIRUS, NAA (HOSP ORDER, SEND-OUT TO REF LAB; TAT 18-24 HRS): SARS-CoV-2, NAA: NOT DETECTED

## 2019-07-14 ENCOUNTER — Other Ambulatory Visit: Payer: Self-pay

## 2019-07-14 ENCOUNTER — Inpatient Hospital Stay (HOSPITAL_COMMUNITY): Payer: Medicare Other | Admitting: Physician Assistant

## 2019-07-14 ENCOUNTER — Encounter (HOSPITAL_COMMUNITY): Payer: Self-pay | Admitting: General Surgery

## 2019-07-14 ENCOUNTER — Inpatient Hospital Stay (HOSPITAL_COMMUNITY)
Admission: RE | Admit: 2019-07-14 | Discharge: 2019-07-16 | DRG: 328 | Disposition: A | Discharge status: Home / Self Care | Payer: Medicare Other | Attending: General Surgery | Admitting: General Surgery

## 2019-07-14 ENCOUNTER — Encounter (HOSPITAL_COMMUNITY)
Admission: RE | Disposition: A | Discharge status: Home / Self Care | Payer: Self-pay | Source: Ambulatory Visit | Attending: General Surgery

## 2019-07-14 DIAGNOSIS — Z87442 Personal history of urinary calculi: Secondary | ICD-10-CM

## 2019-07-14 DIAGNOSIS — Z20822 Contact with and (suspected) exposure to covid-19: Secondary | ICD-10-CM | POA: Diagnosis present

## 2019-07-14 DIAGNOSIS — I10 Essential (primary) hypertension: Secondary | ICD-10-CM | POA: Diagnosis present

## 2019-07-14 DIAGNOSIS — K219 Gastro-esophageal reflux disease without esophagitis: Secondary | ICD-10-CM | POA: Diagnosis present

## 2019-07-14 DIAGNOSIS — Z7952 Long term (current) use of systemic steroids: Secondary | ICD-10-CM

## 2019-07-14 DIAGNOSIS — K449 Diaphragmatic hernia without obstruction or gangrene: Principal | ICD-10-CM | POA: Diagnosis present

## 2019-07-14 DIAGNOSIS — Z885 Allergy status to narcotic agent status: Secondary | ICD-10-CM | POA: Diagnosis not present

## 2019-07-14 DIAGNOSIS — Z9889 Other specified postprocedural states: Secondary | ICD-10-CM

## 2019-07-14 DIAGNOSIS — Z86010 Personal history of colonic polyps: Secondary | ICD-10-CM

## 2019-07-14 DIAGNOSIS — E119 Type 2 diabetes mellitus without complications: Secondary | ICD-10-CM | POA: Diagnosis present

## 2019-07-14 DIAGNOSIS — Z79899 Other long term (current) drug therapy: Secondary | ICD-10-CM

## 2019-07-14 HISTORY — DX: Other specified postprocedural states: Z98.890

## 2019-07-14 SURGERY — FUNDOPLICATION, NISSEN, ROBOT-ASSISTED, LAPAROSCOPIC
Anesthesia: General | Site: Abdomen

## 2019-07-14 MED ORDER — ONDANSETRON HCL 4 MG/2ML IJ SOLN
INTRAMUSCULAR | Status: AC
  Filled 2019-07-14: qty 2

## 2019-07-14 MED ORDER — BUPIVACAINE HCL (PF) 0.25 % IJ SOLN
INTRAMUSCULAR | Status: AC
  Filled 2019-07-14: qty 30

## 2019-07-14 MED ORDER — ROCURONIUM BROMIDE 50 MG/5ML IV SOSY
PREFILLED_SYRINGE | INTRAVENOUS | Status: DC | PRN
  Administered 2019-07-14: 10 mg via INTRAVENOUS
  Administered 2019-07-14: 50 mg via INTRAVENOUS

## 2019-07-14 MED ORDER — ALPRAZOLAM 0.25 MG PO TABS: 0.2500 mg | ORAL_TABLET | Freq: Every day | ORAL | Status: DC | PRN

## 2019-07-14 MED ORDER — PHENYLEPHRINE 40 MCG/ML (10ML) SYRINGE FOR IV PUSH (FOR BLOOD PRESSURE SUPPORT)
PREFILLED_SYRINGE | INTRAVENOUS | Status: AC
  Filled 2019-07-14: qty 10

## 2019-07-14 MED ORDER — HYDROCODONE-ACETAMINOPHEN 7.5-325 MG/15ML PO SOLN: 15.0000 mL | Freq: Four times a day (QID) | ORAL | 0 refills | Status: DC | PRN

## 2019-07-14 MED ORDER — EPHEDRINE 5 MG/ML INJ
INTRAVENOUS | Status: AC
  Filled 2019-07-14: qty 10

## 2019-07-14 MED ORDER — ONDANSETRON HCL 4 MG/2ML IJ SOLN: 4.0000 mg | Freq: Once | INTRAMUSCULAR | Status: DC | PRN

## 2019-07-14 MED ORDER — 0.9 % SODIUM CHLORIDE (POUR BTL) OPTIME
TOPICAL | Status: DC | PRN
  Administered 2019-07-14: 1000 mL

## 2019-07-14 MED ORDER — PROPOFOL 500 MG/50ML IV EMUL
INTRAVENOUS | Status: AC
  Filled 2019-07-14: qty 50

## 2019-07-14 MED ORDER — ONDANSETRON HCL 4 MG/2ML IJ SOLN
INTRAMUSCULAR | Status: DC | PRN
  Administered 2019-07-14: 4 mg via INTRAVENOUS

## 2019-07-14 MED ORDER — LACTATED RINGERS IV SOLN: INTRAVENOUS | Status: DC

## 2019-07-14 MED ORDER — ENOXAPARIN SODIUM 40 MG/0.4ML ~~LOC~~ SOLN
40.0000 mg | SUBCUTANEOUS | Status: DC
  Administered 2019-07-15 – 2019-07-16 (×2): 40 mg via SUBCUTANEOUS
  Filled 2019-07-14 (×2): qty 0.4

## 2019-07-14 MED ORDER — METOPROLOL TARTRATE 50 MG PO TABS
50.0000 mg | ORAL_TABLET | Freq: Two times a day (BID) | ORAL | Status: DC
  Administered 2019-07-14 – 2019-07-16 (×4): 50 mg via ORAL
  Filled 2019-07-14 (×4): qty 1

## 2019-07-14 MED ORDER — CHLORHEXIDINE GLUCONATE CLOTH 2 % EX PADS: 6.0000 | MEDICATED_PAD | Freq: Once | CUTANEOUS | Status: DC

## 2019-07-14 MED ORDER — ACETAMINOPHEN 500 MG PO TABS
1000.0000 mg | ORAL_TABLET | ORAL | Status: AC
  Administered 2019-07-14: 1000 mg via ORAL
  Filled 2019-07-14: qty 2

## 2019-07-14 MED ORDER — LIDOCAINE 2% (20 MG/ML) 5 ML SYRINGE
INTRAMUSCULAR | Status: DC | PRN
  Administered 2019-07-14: 80 mg via INTRAVENOUS

## 2019-07-14 MED ORDER — LIDOCAINE HCL 2 % IJ SOLN
INTRAMUSCULAR | Status: AC
  Filled 2019-07-14: qty 20

## 2019-07-14 MED ORDER — ALUM & MAG HYDROXIDE-SIMETH 200-200-20 MG/5ML PO SUSP
15.0000 mL | ORAL | Status: DC | PRN
  Administered 2019-07-14: 15 mL via ORAL
  Filled 2019-07-14 (×2): qty 30

## 2019-07-14 MED ORDER — DEXTROSE-NACL 5-0.9 % IV SOLN: INTRAVENOUS | Status: DC

## 2019-07-14 MED ORDER — KETAMINE HCL 10 MG/ML IJ SOLN
INTRAMUSCULAR | Status: DC | PRN
  Administered 2019-07-14: 30 mg via INTRAVENOUS

## 2019-07-14 MED ORDER — DIPHENHYDRAMINE HCL 50 MG/ML IJ SOLN
INTRAMUSCULAR | Status: DC | PRN
  Administered 2019-07-14: 12.5 mg via INTRAVENOUS

## 2019-07-14 MED ORDER — LACTATED RINGERS IR SOLN
Status: DC | PRN
  Administered 2019-07-14: 1000 mL

## 2019-07-14 MED ORDER — ROCURONIUM BROMIDE 10 MG/ML (PF) SYRINGE
PREFILLED_SYRINGE | INTRAVENOUS | Status: AC
  Filled 2019-07-14: qty 10

## 2019-07-14 MED ORDER — FENTANYL CITRATE (PF) 250 MCG/5ML IJ SOLN
INTRAMUSCULAR | Status: AC
  Filled 2019-07-14: qty 5

## 2019-07-14 MED ORDER — EPHEDRINE SULFATE 50 MG/ML IJ SOLN
INTRAMUSCULAR | Status: DC | PRN
  Administered 2019-07-14: 5 mg via INTRAVENOUS
  Administered 2019-07-14: 10 mg via INTRAVENOUS
  Administered 2019-07-14 (×2): 5 mg via INTRAVENOUS
  Administered 2019-07-14: 10 mg via INTRAVENOUS
  Administered 2019-07-14: 5 mg via INTRAVENOUS

## 2019-07-14 MED ORDER — FENTANYL CITRATE (PF) 100 MCG/2ML IJ SOLN: 25.0000 ug | INTRAMUSCULAR | Status: DC | PRN

## 2019-07-14 MED ORDER — SUGAMMADEX SODIUM 200 MG/2ML IV SOLN
INTRAVENOUS | Status: DC | PRN
  Administered 2019-07-14: 250 mg via INTRAVENOUS

## 2019-07-14 MED ORDER — PROPOFOL 500 MG/50ML IV EMUL
INTRAVENOUS | Status: DC | PRN
  Administered 2019-07-14: 25 ug/kg/min via INTRAVENOUS

## 2019-07-14 MED ORDER — MIDAZOLAM HCL 2 MG/2ML IJ SOLN
INTRAMUSCULAR | Status: AC
  Filled 2019-07-14: qty 2

## 2019-07-14 MED ORDER — ONDANSETRON HCL 4 MG/2ML IJ SOLN: 4.0000 mg | Freq: Four times a day (QID) | INTRAMUSCULAR | Status: DC | PRN

## 2019-07-14 MED ORDER — PROPOFOL 10 MG/ML IV BOLUS
INTRAVENOUS | Status: DC | PRN
  Administered 2019-07-14: 130 mg via INTRAVENOUS

## 2019-07-14 MED ORDER — LIDOCAINE 2% (20 MG/ML) 5 ML SYRINGE
INTRAMUSCULAR | Status: AC
  Filled 2019-07-14: qty 5

## 2019-07-14 MED ORDER — HYDRALAZINE HCL 20 MG/ML IJ SOLN: 10.0000 mg | INTRAMUSCULAR | Status: DC | PRN

## 2019-07-14 MED ORDER — MIDAZOLAM HCL 5 MG/5ML IJ SOLN
INTRAMUSCULAR | Status: DC | PRN
  Administered 2019-07-14: 1 mg via INTRAVENOUS

## 2019-07-14 MED ORDER — AMLODIPINE BESYLATE 5 MG PO TABS
2.5000 mg | ORAL_TABLET | Freq: Every day | ORAL | Status: DC
  Administered 2019-07-15 – 2019-07-16 (×2): 2.5 mg via ORAL
  Filled 2019-07-14 (×2): qty 1

## 2019-07-14 MED ORDER — LIDOCAINE 2% (20 MG/ML) 5 ML SYRINGE
INTRAMUSCULAR | Status: DC | PRN
  Administered 2019-07-14: 1.5 mg/kg/h via INTRAVENOUS

## 2019-07-14 MED ORDER — HYDROMORPHONE HCL 1 MG/ML IJ SOLN
1.0000 mg | INTRAMUSCULAR | Status: DC | PRN
  Administered 2019-07-14: 1 mg via INTRAVENOUS
  Filled 2019-07-14: qty 1

## 2019-07-14 MED ORDER — PROPOFOL 10 MG/ML IV BOLUS
INTRAVENOUS | Status: AC
  Filled 2019-07-14: qty 20

## 2019-07-14 MED ORDER — DEXAMETHASONE SODIUM PHOSPHATE 4 MG/ML IJ SOLN
INTRAMUSCULAR | Status: DC | PRN
  Administered 2019-07-14: 8 mg via INTRAVENOUS

## 2019-07-14 MED ORDER — ONDANSETRON 4 MG PO TBDP: 4.0000 mg | ORAL_TABLET | Freq: Four times a day (QID) | ORAL | Status: DC | PRN

## 2019-07-14 MED ORDER — ENSURE PRE-SURGERY PO LIQD
296.0000 mL | Freq: Once | ORAL | Status: DC
  Filled 2019-07-14: qty 296

## 2019-07-14 MED ORDER — CEFAZOLIN SODIUM-DEXTROSE 2-4 GM/100ML-% IV SOLN
2.0000 g | INTRAVENOUS | Status: AC
  Administered 2019-07-14: 14:00:00 2 g via INTRAVENOUS
  Filled 2019-07-14: qty 100

## 2019-07-14 MED ORDER — ENSURE PRE-SURGERY PO LIQD
592.0000 mL | Freq: Once | ORAL | Status: DC
  Filled 2019-07-14: qty 592

## 2019-07-14 MED ORDER — BUPIVACAINE HCL (PF) 0.25 % IJ SOLN
INTRAMUSCULAR | Status: DC | PRN
  Administered 2019-07-14: 20 mL

## 2019-07-14 MED ORDER — MERCAPTOPURINE 50 MG PO TABS
50.0000 mg | ORAL_TABLET | Freq: Every day | ORAL | Status: DC
  Filled 2019-07-14 (×2): qty 1

## 2019-07-14 MED ORDER — DEXAMETHASONE SODIUM PHOSPHATE 10 MG/ML IJ SOLN
INTRAMUSCULAR | Status: AC
  Filled 2019-07-14: qty 1

## 2019-07-14 MED ORDER — FENTANYL CITRATE (PF) 100 MCG/2ML IJ SOLN
INTRAMUSCULAR | Status: DC | PRN
  Administered 2019-07-14: 50 ug via INTRAVENOUS
  Administered 2019-07-14: 100 ug via INTRAVENOUS

## 2019-07-14 MED ORDER — PHENYLEPHRINE 40 MCG/ML (10ML) SYRINGE FOR IV PUSH (FOR BLOOD PRESSURE SUPPORT)
PREFILLED_SYRINGE | INTRAVENOUS | Status: DC | PRN
  Administered 2019-07-14 (×2): 40 ug via INTRAVENOUS
  Administered 2019-07-14: 80 ug via INTRAVENOUS
  Administered 2019-07-14: 40 ug via INTRAVENOUS
  Administered 2019-07-14: 80 ug via INTRAVENOUS
  Administered 2019-07-14: 40 ug via INTRAVENOUS
  Administered 2019-07-14: 80 ug via INTRAVENOUS

## 2019-07-14 SURGICAL SUPPLY — 56 items
APPLIER CLIP 5 13 M/L LIGAMAX5 (MISCELLANEOUS)
APPLIER CLIP ROT 10 11.4 M/L (STAPLE)
BLADE SURG SZ11 CARB STEEL (BLADE) ×3 IMPLANT
CHLORAPREP W/TINT 26 (MISCELLANEOUS) ×3 IMPLANT
CLIP APPLIE 5 13 M/L LIGAMAX5 (MISCELLANEOUS) IMPLANT
CLIP APPLIE ROT 10 11.4 M/L (STAPLE) IMPLANT
CLIP VESOLOCK LG 6/CT PURPLE (CLIP) IMPLANT
CLIP VESOLOCK MED LG 6/CT (CLIP) IMPLANT
COVER SURGICAL LIGHT HANDLE (MISCELLANEOUS) ×3 IMPLANT
COVER TIP SHEARS 8 DVNC (MISCELLANEOUS) ×1 IMPLANT
COVER TIP SHEARS 8MM DA VINCI (MISCELLANEOUS) ×2
COVER WAND RF STERILE (DRAPES) ×3 IMPLANT
DECANTER SPIKE VIAL GLASS SM (MISCELLANEOUS) ×3 IMPLANT
DERMABOND ADVANCED (GAUZE/BANDAGES/DRESSINGS) ×2
DERMABOND ADVANCED .7 DNX12 (GAUZE/BANDAGES/DRESSINGS) ×1 IMPLANT
DRAIN PENROSE 18X1/2 LTX STRL (DRAIN) ×3 IMPLANT
DRAPE ARM DVNC X/XI (DISPOSABLE) ×4 IMPLANT
DRAPE COLUMN DVNC XI (DISPOSABLE) ×1 IMPLANT
DRAPE DA VINCI XI ARM (DISPOSABLE) ×8
DRAPE DA VINCI XI COLUMN (DISPOSABLE) ×2
ELECT REM PT RETURN 15FT ADLT (MISCELLANEOUS) ×3 IMPLANT
ENDOLOOP SUT PDS II  0 18 (SUTURE)
ENDOLOOP SUT PDS II 0 18 (SUTURE) IMPLANT
GAUZE 4X4 16PLY RFD (DISPOSABLE) ×3 IMPLANT
GLOVE BIO SURGEON STRL SZ7.5 (GLOVE) ×6 IMPLANT
GOWN STRL REUS W/TWL XL LVL3 (GOWN DISPOSABLE) ×12 IMPLANT
KIT BASIN OR (CUSTOM PROCEDURE TRAY) ×3 IMPLANT
KIT TURNOVER KIT A (KITS) IMPLANT
MARKER SKIN DUAL TIP RULER LAB (MISCELLANEOUS) ×3 IMPLANT
MESH BIO-A 7X10 SYN MAT (Mesh General) ×3 IMPLANT
NEEDLE HYPO 22GX1.5 SAFETY (NEEDLE) ×3 IMPLANT
NEEDLE INSUFFLATION 14GA 120MM (NEEDLE) ×3 IMPLANT
OBTURATOR OPTICAL STANDARD 8MM (TROCAR)
OBTURATOR OPTICAL STND 8 DVNC (TROCAR)
OBTURATOR OPTICALSTD 8 DVNC (TROCAR) IMPLANT
PACK CARDIOVASCULAR III (CUSTOM PROCEDURE TRAY) ×3 IMPLANT
PAD POSITIONING PINK XL (MISCELLANEOUS) ×3 IMPLANT
PENCIL SMOKE EVACUATOR (MISCELLANEOUS) IMPLANT
SCISSORS LAP 5X35 DISP (ENDOMECHANICALS) ×3 IMPLANT
SEAL CANN UNIV 5-8 DVNC XI (MISCELLANEOUS) ×4 IMPLANT
SEAL XI 5MM-8MM UNIVERSAL (MISCELLANEOUS) ×8
SEALER VESSEL DA VINCI XI (MISCELLANEOUS) ×2
SEALER VESSEL EXT DVNC XI (MISCELLANEOUS) ×1 IMPLANT
SET IRRIG TUBING LAPAROSCOPIC (IRRIGATION / IRRIGATOR) ×3 IMPLANT
SOL ANTI FOG 6CC (MISCELLANEOUS) ×1 IMPLANT
SOLUTION ANTI FOG 6CC (MISCELLANEOUS) ×2
SOLUTION ELECTROLUBE (MISCELLANEOUS) ×3 IMPLANT
SUT ETHIBOND 0 36 GRN (SUTURE) ×6 IMPLANT
SUT MNCRL AB 4-0 PS2 18 (SUTURE) ×3 IMPLANT
SUT SILK 0 SH 30 (SUTURE) ×6 IMPLANT
SUT SILK 2 0 SH (SUTURE) ×6 IMPLANT
SYR 20ML LL LF (SYRINGE) ×3 IMPLANT
TOWEL OR 17X26 10 PK STRL BLUE (TOWEL DISPOSABLE) ×3 IMPLANT
TOWEL OR NON WOVEN STRL DISP B (DISPOSABLE) ×3 IMPLANT
TROCAR ADV FIXATION 5X100MM (TROCAR) ×3 IMPLANT
TUBING INSUFFLATION 10FT LAP (TUBING) ×3 IMPLANT

## 2019-07-14 NOTE — Transfer of Care (Signed)
Immediate Anesthesia Transfer of Care Note  Patient: Caroline Dean  Procedure(s) Performed: Procedure(s): XI ROBOTIC HIATAL HERNIA REPAIR WITH MESH AND NISSEN FUNDOPLICATION (N/A)  Patient Location: PACU  Anesthesia Type:General  Level of Consciousness: Patient easily awoken, comfortable, cooperative, following commands, responds to stimulation.   Airway & Oxygen Therapy: Patient spontaneously breathing, ventilating well, oxygen via simple oxygen mask.  Post-op Assessment: Report given to PACU RN, vital signs reviewed and stable, moving all extremities.   Post vital signs: Reviewed and stable.  Complications: No apparent anesthesia complications Last Vitals:  Vitals Value Taken Time  BP 144/74 07/14/19 1527  Temp    Pulse 78 07/14/19 1530  Resp 24 07/14/19 1530  SpO2 100 % 07/14/19 1530  Vitals shown include unvalidated device data.  Last Pain:  Vitals:   07/14/19 1028  TempSrc: Oral  PainSc:       Patients Stated Pain Goal: 3 (31/25/08 7199)  Complications: No apparent anesthesia complications

## 2019-07-14 NOTE — Anesthesia Procedure Notes (Signed)
Procedure Name: Intubation Date/Time: 07/14/2019 1:27 PM Performed by: Deliah Boston, CRNA Pre-anesthesia Checklist: Patient identified, Emergency Drugs available, Suction available and Patient being monitored Patient Re-evaluated:Patient Re-evaluated prior to induction Oxygen Delivery Method: Circle system utilized Preoxygenation: Pre-oxygenation with 100% oxygen Induction Type: IV induction Ventilation: Mask ventilation without difficulty Laryngoscope Size: Glidescope and 3 Grade View: Grade I Tube type: Oral Tube size: 7.0 mm Number of attempts: 1 Airway Equipment and Method: Stylet,  Oral airway and Video-laryngoscopy Placement Confirmation: ETT inserted through vocal cords under direct vision,  positive ETCO2 and breath sounds checked- equal and bilateral Secured at: 22 cm Tube secured with: Tape Dental Injury: Teeth and Oropharynx as per pre-operative assessment  Difficulty Due To: Difficulty was anticipated, Difficult Airway- due to anterior larynx and Difficult Airway- due to limited oral opening Comments: Planned to use glidescope after assessing pt  in preop

## 2019-07-14 NOTE — Anesthesia Postprocedure Evaluation (Signed)
Anesthesia Post Note  Patient: [facility]  Procedure(s) Performed: XI ROBOTIC HIATAL HERNIA REPAIR WITH MESH AND NISSEN FUNDOPLICATION (N/A Abdomen)     Patient location during evaluation: PACU Anesthesia Type: General Level of consciousness: awake and alert Pain management: pain level controlled Vital Signs Assessment: post-procedure vital signs reviewed and stable Respiratory status: spontaneous breathing, nonlabored ventilation, respiratory function stable and patient connected to nasal cannula oxygen Cardiovascular status: blood pressure returned to baseline and stable Postop Assessment: no apparent nausea or vomiting Anesthetic complications: no    Last Vitals:  Vitals:   07/14/19 1645 07/14/19 1700  BP: (!) 157/77 (!) 151/83  Pulse: 68 69  Resp: 14 13  Temp:  (!) 36.4 C  SpO2: 99% 97%    Last Pain:  Vitals:   07/14/19 1700  TempSrc:   PainSc: 4                  Catalina Gravel

## 2019-07-14 NOTE — Op Note (Signed)
07/14/2019  3:11 PM  PATIENT:  Caroline Dean  79 y.o. female  PRE-OPERATIVE DIAGNOSIS:  HIATAL HERNIA  POST-OPERATIVE DIAGNOSIS:  HIATAL HERNIA  PROCEDURE:  Procedure(s): XI ROBOTIC HIATAL HERNIA REPAIR WITH MESH AND [place] FUNDOPLICATION (N/A)  SURGEON:  Surgeon(s) and Role:    * Ralene Ok, MD - Primary    * Leighton Ruff, MD - Assisting.  My Assistant was needed for retraction, identification, and bedside instrument introduction.  ANESTHESIA:   local and general  EBL:  minimal   BLOOD ADMINISTERED:none  DRAINS: none   LOCAL MEDICATIONS USED:  BUPIVICAINE   SPECIMEN:  No Specimen  DISPOSITION OF SPECIMEN:  N/A  COUNTS:  YES  TOURNIQUET:  * No tourniquets in log *  DICTATION: .Dragon Dictation  The patient was taken back to the operating room and placed in the supine position with bilateral SCDs in place. The patient was prepped and draped in the usual sterile fashion. After appropriate antibiotics were confirmed a timeout was called and all facts were verified.   A Veress needle technique was used to insufflate the abdomen to 15 mm of mercury the paramedian stab incision. Subsequent to this an 8 mm trocar was introduced as was a 8 millimeter camera. At this time the subsequent robotic trochars x3, were then placed adjacent to this trocar approximately 8-10 cm away. Each trocar was inserted under direct visualization, there were total of 4 trochars. The assistant trocar was then placed in the right lower quadrant under direct visualization. The Nathanson retractor was then visualized inserted into the abdomen and the incision just to the left of the falciform ligament. This was then placed to retract the liver appropriately. At this time the patient was positioned in reverse Trendelenburg.   At this time the robot patient cart was brought to the bedside and placed in good position and the arms were docked to the trochars appropriately. At this time I proceeded to  incised the gastrohepatic ligament.  At this time I proceeded to mobilize the stomach inferiorly and visualize the right crus. The peritoneum over the right crus was incised and right crus was identified. I proceeded to dissect this inferiorly until the left crus was seen joining the right crus. Once the right crus was adequately dissected we turned our to the left crus which was dissected away. This required traction of the stomach to the right side. Once this was visualized we then proceeded to circumferentially dissect the esophagus away from the surrounding tissue. The anterior and posterior vagus was seen along the esophagus within the chest cavity.  These were both preserved throughout the entire case.  At this time the phrenoesophageal fat pad was dissected away from the esophagus. There was a large-sized hiatal hernia seen. I mobilized the esophagus cephalad approximately 4-5 cm, clearing away the surrounding tissue. The anterior hernia sac was dissected away from the stomach and esophagus and discarded.  At this time we turned our attention to the greater curvature the stomach and the omentum was mobilized using the robotic vessel sealer. This was taken up to the greater curvature to the hiatus. This mobilized the entire greater curvature to allow mobilization and the wrap. I then proceeded to bring the greater curvature the stomach posterior to the esophagus, and a shoeshine technique was used to evaluate the mobilization of the greater curvature.   At this time I proceeded to close the hiatus using figure of 8 0 Ethibonds x 2 and an interrupted x1. This brought together  the hiatal closure without undue stricture to the esophagus.   A piece of Gore Bio A hiatal mesh was placed over the hiatal closure and sutured to the crus using 0 Ethibonds sutures x 4.  At this time the greater curvature was brought around the esophagus and sutured using 0 silk sutures interrupted fashion approximately 1 cm apart  x3 on each side of the esophagus in a Toupet fashion. A left and right collar stitch was then used to gastropexy the stomach from the wrap to the diaphragm just lateral to the left crus as.  A second collar stitch was placed from the wrap to the right crus. The wrap lay posterior on its own with undue tension.  The wrap lay loose with no strangulation of the esophagus.  At this time the robot was undocked. The liver trocar was removed. At this time insufflation was evacuated. Skin was reapproximated for Monocryl subcuticular fashion. The skin was then dressed with Dermabond. The patient tolerated the procedure well and was taken to the recovery room in stable condition.    PLAN OF CARE: Admit for overnight observation  PATIENT DISPOSITION:  PACU - hemodynamically stable.   Delay start of Pharmacological VTE agent (>24hrs) due to surgical blood loss or risk of bleeding: yes

## 2019-07-14 NOTE — H&P (Signed)
History of Present Illness  The patient is a 79 year old female who presents with a hiatal hernia.  Chief Complaint:  Large hiatal hernia  Patient is a 79 year old female with history of ulcerative colitis, hypertension, who comes in secondary to recent ER visit hospitalization for a large hiatal hernia. Patient states that she's recently admitted to the hospital secondary to large hernia and volvulus. Patient on NG tube decompression did well. She was eventually discharged we'll schedule for follow-up.  Patient states that in the past she's noticed approximately 3 years of chest pain, dysphagia. She states that this recent recurrence was the worst. Patient has had history of reflux for 10+ years. She is currently on omeprazole. She states that she's had no issues with chest pain and spasm was recently.  Patient has not had a previous upper endoscopy. She sees Dr. [name] in [facility] for her colonoscopies and UC.  I currently cannot see the films however we'll have the sent up .  She's had no previous abdominal surgery.   Past Surgical History Colon Polyp Removal - Colonoscopy   Diagnostic Studies History Colonoscopy 1-5 years ago   Allergies Codeine Phosphate *ANALGESICS - OPIOID*  Mesalamine ER *GASTROINTESTINAL AGENTS - MISC.*  Allergies Reconcil d  Medication History  amLODIPine Besylate (2.5MG Tablet, Oral) Active.  Mercaptopurine (50MG Tablet, Oral) Active.  Metoprolol Tartrate (50MG Tablet, Oral) Active.  Omeprazole (20MG Capsule DR, Oral) Active.  predniSONE (10MG Tablet, Oral) Active.  Simvastatin (20MG Tablet, Oral) Active.  sulfaSALAzine (500MG Tablet DR, Oral) Active.  ALPRAZolam (0.25MG Tablet, Oral) Active.  Vitamin D (3000 Oral) Specific strength unknown - Active.  Ferrous Sulfate (325 (65 Fe)MG Tablet, Oral) Active.  Fish Oil (Oral) Specific strength unknown - Active.  Medications Reconciled   Social History  Alcohol use Occasional alcohol use.  Caffeine use  Coffee.  No drug use  Tobacco use Former smoker.    Family History   Diabetes Mellitus Mother.  Heart Disease Father, Mother.  Hypertension Mother.  Thyroid problems Mother.   Other Problems  Diverticulosis  Gastroesophageal Reflux Disease  High blood pressure  Hypercholesterolemia  Kidney Stone  Ulcerative Colitis   Review of Systems  General Not Present- Appetite Loss, Chills, Fatigue, Fever, Night Sweats, Weight Gain and Weight Loss.  Skin Not Present- Change in Wart/Mole, Dryness, Hives, Jaundice, New Lesions, Non-Healing Wounds, Rash and Ulcer.  HEENT Not Present- Earache, Hearing Loss, Hoarseness, Nose Bleed, Oral Ulcers, Ringing in the Ears, Seasonal Allergies, Sinus Pain, Sore Throat, Visual Disturbances, Wears glasses/contact lenses and Yellow Eyes.  Respiratory Present- Snoring. Not Present- Bloody sputum, Chronic Cough, Difficulty Breathing and Wheezing.  Breast Not Present- Breast Mass, Breast Pain, Nipple Discharge and Skin Changes.  Cardiovascular Not Present- Chest Pain, Difficulty Breathing Lying Down, Leg Cramps, Palpitations, Rapid Heart Rate, Shortness of Breath and Swelling of Extremities.  Gastrointestinal Not Present- Abdominal Pain, Bloating, Bloody Stool, Change in Bowel Habits, Chronic diarrhea, Constipation, Difficulty Swallowing, Excessive gas, Gets full quickly at meals, Hemorrhoids, Indigestion, Nausea, Rectal Pain and Vomiting.  Female Genitourinary Not Present- Frequency, Nocturia, Painful Urination, Pelvic Pain and Urgency.  Musculoskeletal Present- Swelling of Extremities. Not Present- Back Pain, Joint Pain, Joint Stiffness, Muscle Pain and Muscle Weakness.  Neurological Not Present- Decreased Memory, Fainting, Headaches, Numbness, Seizures, Tingling, Tremor, Trouble walking and Weakness.  Psychiatric Not Present- Anxiety, Bipolar, Change in Sleep Pattern, Depression, Fearful and Frequent crying.  Endocrine Not Present- Cold Intolerance, Excessive  Hunger, Hair Changes, Heat Intolerance, Hot flashes and New Diabetes.  Hematology Not Present- Blood Thinners, Easy Bruising, Excessive bleeding, Gland problems, HIV and Persistent Infections.  All other systems negative   BP (!) 153/82   Pulse (!) 54   Temp 98.5 F (36.9 C) (Oral)   Resp 16   Ht 5' 6"  (1.676 m)   Wt 85.3 kg   SpO2 99%   BMI 30.34 kg/m    Physical Exam The physical exam findings are as follows:  Note: Constitutional: No acute distress, conversant, appears stated age  Eyes: Anicteric sclerae, moist conjunctiva, no lid lag  Neck: No thyromegaly, trachea midline, no cervical lymphadenopathy  Lungs: Clear to auscultation biilaterally, normal respiratory effot  Cardiovascular: regular rate & rhythm, no murmurs, no peripheal edema, pedal pulses 2+  GI: Soft, no masses or hepatosplenomegaly, non-tender to palpation  MSK: Normal gait, no clubbing cyanosis, edema  Skin: No rashes, palpation reveals normal skin turgor  Psychiatric: Appropriate judgment and insight, oriented to person, place, and time    Assessment & Plan HIATAL HERNIA (K44.9)  Impression: 79 year old female with large hiatal hernia, with what appears to be a large amount of stomach with her chest cavity.  Patient will require upper endoscopy, manometry prior to surgery. We will also have her CT scans brought up to visualize the hiatal hernia and the chest contents.  1. She get excellent candidate for robotic hiatal hernia repair with fundoplication.  2. Discussed with patient the risks and benefits of the procedure to include but not limited to: Infection, bleeding, damage to structures, possible pneumothorax, possible recurrence. The patient voiced understanding and wishes to proceed.

## 2019-07-14 NOTE — Plan of Care (Signed)

## 2019-07-14 NOTE — Anesthesia Preprocedure Evaluation (Signed)
Anesthesia Evaluation  Patient identified by MRN, date of birth, ID band Patient awake    Reviewed: Allergy & Precautions, NPO status , Patient's Chart, lab work & pertinent test results, reviewed documented beta blocker date and time   History of Anesthesia Complications Negative for: history of anesthetic complications  Airway Mallampati: IV  TM Distance: >3 FB Neck ROM: Full    Dental  (+) Teeth Intact, Dental Advisory Given, Caps   Pulmonary neg pulmonary ROS,    Pulmonary exam normal breath sounds clear to auscultation       Cardiovascular hypertension, Pt. on home beta blockers and Pt. on medications + dysrhythmias Supra Ventricular Tachycardia  Rhythm:Regular Rate:Bradycardia     Neuro/Psych negative neurological ROS  negative psych ROS   GI/Hepatic Neg liver ROS, hiatal hernia, PUD, GERD  Medicated and Controlled,  Endo/Other  Obesity   Renal/GU negative Renal ROS     Musculoskeletal  (+) Arthritis ,   Abdominal   Peds  Hematology negative hematology ROS (+)   Anesthesia Other Findings Day of surgery medications reviewed with the patient.  Reproductive/Obstetrics                             Anesthesia Physical Anesthesia Plan  ASA: III  Anesthesia Plan: General   Post-op Pain Management:    Induction: Intravenous  PONV Risk Score and Plan: 4 or greater and Dexamethasone, Ondansetron, Propofol infusion and Treatment may vary due to age or medical condition  Airway Management Planned: Oral ETT  Additional Equipment:   Intra-op Plan:   Post-operative Plan: Extubation in OR  Informed Consent: I have reviewed the patients History and Physical, chart, labs and discussed the procedure including the risks, benefits and alternatives for the proposed anesthesia with the patient or authorized representative who has indicated his/her understanding and acceptance.     Dental  advisory given  Plan Discussed with: CRNA  Anesthesia Plan Comments: (2nd PIV)       Anesthesia Quick Evaluation

## 2019-07-15 ENCOUNTER — Encounter (HOSPITAL_COMMUNITY): Payer: Self-pay | Admitting: General Surgery

## 2019-07-15 ENCOUNTER — Inpatient Hospital Stay (HOSPITAL_COMMUNITY): Payer: Medicare Other

## 2019-07-15 LAB — COMPREHENSIVE METABOLIC PANEL
ALT: 224 U/L — ABNORMAL HIGH (ref 0–44)
AST: 193 U/L — ABNORMAL HIGH (ref 15–41)
Albumin: 3.6 g/dL (ref 3.5–5.0)
Alkaline Phosphatase: 54 U/L (ref 38–126)
Anion gap: 12 (ref 5–15)
BUN: 11 mg/dL (ref 8–23)
CO2: 21 mmol/L — ABNORMAL LOW (ref 22–32)
Calcium: 8.4 mg/dL — ABNORMAL LOW (ref 8.9–10.3)
Chloride: 106 mmol/L (ref 98–111)
Creatinine, Ser: 0.64 mg/dL (ref 0.44–1.00)
GFR calc Af Amer: >60 mL/min (ref >60)
GFR calc non Af Amer: >60 mL/min (ref >60)
Glucose, Bld: 111 mg/dL — ABNORMAL HIGH (ref 70–99)
Potassium: 4.5 mmol/L (ref 3.5–5.1)
Sodium: 139 mmol/L (ref 135–145)
Total Bilirubin: 0.8 mg/dL (ref 0.3–1.2)
Total Protein: 6.4 g/dL — ABNORMAL LOW (ref 6.5–8.1)

## 2019-07-15 MED ORDER — ACETAMINOPHEN 325 MG PO TABS
650.0000 mg | ORAL_TABLET | ORAL | Status: DC | PRN
  Administered 2019-07-15 – 2019-07-16 (×3): 650 mg via ORAL
  Filled 2019-07-15 (×5): qty 2

## 2019-07-15 MED ORDER — DIATRIZOATE MEGLUMINE & SODIUM 66-10 % PO SOLN
ORAL | Status: AC
  Filled 2019-07-15: qty 60

## 2019-07-15 MED ORDER — DEXAMETHASONE SODIUM PHOSPHATE 4 MG/ML IJ SOLN
4.0000 mg | Freq: Two times a day (BID) | INTRAMUSCULAR | Status: DC
  Administered 2019-07-15 – 2019-07-16 (×3): 4 mg via INTRAVENOUS
  Filled 2019-07-15 (×3): qty 1

## 2019-07-15 NOTE — Progress Notes (Signed)
Patient did ok with thin liquids like tea and water, but did have some problems with Jello.  Dr Windle Guard was called earlier when doing ok with liquids and advanced diet to pureed diet. I discussed since she was having some issues Dr Windle Guard would want to keep for some steroids and see how she improves. Will continue to monitor her eating status.

## 2019-07-15 NOTE — Progress Notes (Signed)
1 Day Post-Op Robotic assisted Nissen Subjective: Doing well.  Denies any pain  Objective: Vital signs in last 24 hours: Temp:  [94 F (34.4 C)-98.6 F (37 C)] 98.1 F (36.7 C) (01/23 0430) Pulse Rate:  [54-120] 68 (01/23 0430) Resp:  [13-24] 16 (01/23 0430) BP: (120-172)/(70-94) 134/70 (01/23 0430) SpO2:  [93 %-100 %] 94 % (01/23 0430) Weight:  [85.3 kg] 85.3 kg (01/22 1006)   Intake/Output from previous day: 01/22 0701 - 01/23 0700 In: 2320 [P.O.:60; I.V.:2160; IV Piggyback:100] Out: 525 [Urine:500; Blood:25] Intake/Output this shift: Total I/O In: -  Out: 200 [Urine:200]   General appearance: alert and cooperative GI: normal findings: soft, non-tender  Incision: no significant drainage  Lab Results:  No results for input(s): WBC, HGB, HCT, PLT in the last 72 hours. BMET Recent Labs    07/15/19 0622  NA 139  K 4.5  CL 106  CO2 21*  GLUCOSE 111*  BUN 11  CREATININE 0.64  CALCIUM 8.4*   PT/INR No results for input(s): LABPROT, INR in the last 72 hours. ABG No results for input(s): PHART, HCO3 in the last 72 hours.  Invalid input(s): PCO2, PO2  MEDS, Scheduled . amLODipine  2.5 mg Oral Daily  . enoxaparin (LOVENOX) injection  40 mg Subcutaneous Q24H  . mercaptopurine  50 mg Oral Daily  . metoprolol tartrate  50 mg Oral BID    Studies/Results: No results found.  Assessment: s/p Procedure(s): XI ROBOTIC HIATAL HERNIA REPAIR WITH MESH AND NISSEN FUNDOPLICATION Patient Active Problem List   Diagnosis Date Noted  . 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 Dysphagia   . B12 DEFICIENCY 04/24/2009  . FATTY LIVER DISEASE 05/11/2008  . IRON DEFICIENCY 08/25/2007  . AGITATION 08/15/2007  . INTERNAL HEMORRHOIDS 08/15/2007  . Gastroesophageal reflux disease 08/15/2007  . ULCERATIVE COLITIS 08/15/2007  . 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/2007    Expected post op course  Plan: UGI this am  Advance diet if swallow  eval shows no leak Possible d/c later today if tolerates a diet    LOS: 1 day     .Rosario Adie, MD [facility], [place]    07/15/2019 7:52 AM

## 2019-07-15 NOTE — Progress Notes (Signed)
UGI with tight wrap and delayed passage of liquid into stomach. Will start sips of clears and decadron, monitor PO tolerance

## 2019-07-15 NOTE — Plan of Care (Signed)

## 2019-07-15 NOTE — Discharge Instructions (Addendum)
EATING AFTER YOUR ESOPHAGEAL SURGERY (Stomach Fundoplication, Hiatal Hernia repair, Achalasia surgery, etc)  ######################################################################  EAT Start with a pureed / full liquid diet (see below) Gradually transition to a high fiber diet with a fiber supplement over the next month after discharge.    WALK Walk an hour a day.  Control your pain to do that.    CONTROL PAIN Control pain so that you can walk, sleep, tolerate sneezing/coughing, go up/down stairs.  HAVE A BOWEL MOVEMENT DAILY Keep your bowels regular to avoid problems.  OK to try a laxative to override constipation.  OK to use an antidairrheal to slow down diarrhea.  Call if not better after 2 tries  CALL IF YOU HAVE PROBLEMS/CONCERNS Call if you are still struggling despite following these instructions. Call if you have concerns not answered by these instructions  ######################################################################   After your esophageal surgery, expect some sticking with swallowing over the next 1-2 months.    If food sticks when you eat, it is called "dysphagia".  This is due to swelling around your esophagus at the wrap & hiatal diaphragm repair.  It will gradually ease off over the next few months.  To help you through this temporary phase, we start you out on a pureed (blenderized) diet.  Your first meal in the hospital was thin liquids.  You should have been given a pureed diet by the time you left the hospital.  We ask patients to stay on a pureed diet for the first 2-3 weeks to avoid anything getting "stuck" near your recent surgery.  Don't be alarmed if your ability to swallow doesn't progress according to this plan.  Everyone is different and some diets can advance more or less quickly.    It is often helpful to crush your medications or split them as they can sometimes stick, especially the first week or so.   Some BASIC RULES to follow  are:  Maintain an upright position whenever eating or drinking.  Take small bites - just a teaspoon size bite at a time.  Eat slowly.  It may also help to eat only one food at a time.  Consider nibbling through smaller, more frequent meals & avoid the urge to eat BIG meals  Do not push through feelings of fullness, nausea, or bloatedness  Do not mix solid foods and liquids in the same mouthful  Try not to "wash foods down" with large gulps of liquids.  Avoid carbonated (bubbly/fizzy) drinks.    Avoid foods that make you feel gassy or bloated.  Start with bland foods first.  Wait on trying greasy, fried, or spicy meals until you are tolerating more bland solids well.  Understand that it will be hard to burp and belch at first.  This gradually improves with time.  Expect to be more gassy/flatulent/bloated initially.  Walking will help your body manage it better.  Consider using medications for bloating that contain simethicone such as  Maalox or Gas-X   Consider crushing her medications, especially smaller pills.  The ability to swallow pills should get easier after a few weeks  Eat in a relaxed atmosphere & minimize distractions.  Avoid talking while eating.    Do not use straws.  Following each meal, sit in an upright position (90 degree angle) for 60 to 90 minutes.  Going for a short walk can help as well  If food does stick, don't panic.  Try to relax and let the food pass on its own.  Sipping WARM LIQUID such as strong hot black tea can also help slide it down.   Be gradual in changes & use common sense:  -If you easily tolerating a certain "level" of foods, advance to the next level gradually -If you are having trouble swallowing a particular food, then avoid it.   -If food is sticking when you advance your diet, go back to thinner previous diet (the lower LEVEL) for 1-2 days.  LEVEL 1 = PUREED DIET  Do for the first 2 WEEKS AFTER SURGERY  -Foods in this group are  pureed or blenderized to a smooth, mashed potato-like consistency.  -If necessary, the pureed foods can keep their shape with the addition of a thickening agent.   -Meat should be pureed to a smooth, pasty consistency.  Hot broth or gravy may be added to the pureed meat, approximately 1 oz. of liquid per 3 oz. serving of meat. -CAUTION:  If any foods do not puree into a smooth consistency, swallowing will be more difficult.  (For example, nuts or seeds sometimes do not blend well.)  Hot Foods Cold Foods  Pureed scrambled eggs and cheese Pureed cottage cheese  Baby cereals Thickened juices and nectars  Thinned cooked cereals (no lumps) Thickened milk or eggnog  Pureed [place] toast or pancakes Ensure  Mashed potatoes Ice cream  Pureed parsley, au gratin, scalloped potatoes, candied sweet potatoes Fruit or [place] ice, sherbet  Pureed buttered or alfredo noodles Plain yogurt  Pureed vegetables (no corn or peas) Instant breakfast  Pureed soups and creamed soups Smooth pudding, mousse, custard  Pureed scalloped apples Whipped gelatin  Gravies Sugar, syrup, honey, jelly  Sauces, cheese, tomato, barbecue, white, creamed Cream  Any baby food Creamer  Alcohol in moderation (not beer or champagne) Margarine  Coffee or tea Mayonnaise   Ketchup, mustard   Apple sauce   SAMPLE MENU:  PUREED DIET Breakfast Lunch Dinner   Orange juice, 1/2 cup  Cream of wheat, 1/2 cup  Pineapple juice, 1/2 cup  Pureed [place], barley soup, 3/4 cup  Pureed [place] chicken, 3 oz   Scrambled eggs, mashed or blended with cheese, 1/2 cup  Tea or coffee, 1 cup   Whole milk, 1 cup   Non-dairy creamer, 2 Tbsp.  Mashed potatoes, 1/2 cup  Pureed cooled broccoli, 1/2 cup  Apple sauce, 1/2 cup  Coffee or tea  Mashed potatoes, 1/2 cup  Pureed spinach, 1/2 cup  Frozen yogurt, 1/2 cup  Tea or coffee      LEVEL 2 = SOFT DIET  After your first 2 weeks, you can advance to a soft diet.   Keep on this  diet until everything goes down easily.  Hot Foods Cold Foods  White fish Cottage cheese  Stuffed fish Junior baby fruit  Baby food meals Semi thickened juices  Minced soft cooked, scrambled, poached eggs nectars  Souffle & omelets Ripe mashed bananas  Cooked cereals Canned fruit, pineapple sauce, milk  potatoes Milkshake  Buttered or Alfredo noodles Custard  Cooked cooled vegetable Puddings, including tapioca  Sherbet Yogurt  Vegetable soup or alphabet soup Fruit ice, [place] ice  Gravies Whipped gelatin  Sugar, syrup, honey, jelly Junior baby desserts  Sauces:  Cheese, creamed, barbecue, tomato, white Cream  Coffee or tea Margarine   SAMPLE MENU:  LEVEL 2 Breakfast Lunch Dinner   Orange juice, 1/2 cup  Oatmeal, 1/2 cup  Scrambled eggs with cheese, 1/2 cup  Decaffeinated tea, 1 cup  Whole milk, 1 cup  Non-dairy creamer, 2 Tbsp  Pineapple juice, 1/2 cup  Minced beef, 3 oz  Gravy, 2 Tbsp  Mashed potatoes, 1/2 cup  Minced fresh broccoli, 1/2 cup  Applesauce, 1/2 cup  Coffee, 1 cup  [place], barley soup, 3/4 cup  Minced Hawaiian chicken, 3 oz  Mashed potatoes, 1/2 cup  Cooked spinach, 1/2 cup  Frozen yogurt, 1/2 cup  Non-dairy creamer, 2 Tbsp      LEVEL 3 = CHOPPED DIET  -After all the foods in level 2 (soft diet) are passing through well you should advance up to more chopped foods.  -It is still important to cut these foods into small pieces and eat slowly.  Hot Foods Cold Foods  Poultry Cottage cheese  Chopped Swedish meatballs Yogurt  Meat salads (ground or flaked meat) Milk  Flaked fish (tuna) Milkshakes  Poached or scrambled eggs Soft, cold, dry cereal  Souffles and omelets Fruit juices or nectars  Cooked cereals Chopped canned fruit  Chopped [place] toast or pancakes Canned fruit cocktail  Noodles or pasta (no rice) Pudding, mousse, custard  Cooked vegetables (no frozen peas, corn, or mixed vegetables) Green salad  Canned small sweet peas  Ice cream  Creamed soup or vegetable soup Fruit ice, [place] ice  Pureed vegetable soup or alphabet soup Non-dairy creamer  Ground scalloped apples Margarine  Gravies Mayonnaise  Sauces:  Cheese, creamed, barbecue, tomato, white Ketchup  Coffee or tea Mustard   SAMPLE MENU:  LEVEL 3 Breakfast Lunch Dinner   Orange juice, 1/2 cup  Oatmeal, 1/2 cup  Scrambled eggs with cheese, 1/2 cup  Decaffeinated tea, 1 cup  Whole milk, 1 cup  Non-dairy creamer, 2 Tbsp  Ketchup, 1 Tbsp  Margarine, 1 tsp  Salt, 1/4 tsp  Sugar, 2 tsp  Pineapple juice, 1/2 cup  Ground beef, 3 oz  Gravy, 2 Tbsp  Mashed potatoes, 1/2 cup  Cooked spinach, 1/2 cup  Applesauce, 1/2 cup  Decaffeinated coffee  Whole milk  Non-dairy creamer, 2 Tbsp  Margarine, 1 tsp  Salt, 1/4 tsp  Pureed [place], barley soup, 3/4 cup  Barbecue chicken, 3 oz  Mashed potatoes, 1/2 cup  Ground fresh broccoli, 1/2 cup  Frozen yogurt, 1/2 cup  Decaffeinated tea, 1 cup  Non-dairy creamer, 2 Tbsp  Margarine, 1 tsp  Salt, 1/4 tsp  Sugar, 1 tsp    LEVEL 4:  REGULAR FOODS  -Foods in this group are soft, moist, regularly textured foods.   -This level includes meat and breads, which tend to be the hardest things to swallow.   -Eat very slowly, chew well and continue to avoid carbonated drinks. -most people are at this level in 4-6 weeks  Hot Foods Cold Foods  Baked fish or skinned Soft cheeses - cottage cheese  Souffles and omelets Cream cheese  Eggs Yogurt  Stuffed shells Milk  Spaghetti with meat sauce Milkshakes  Cooked cereal Cold dry cereals (no nuts, dried fruit, coconut)  [place] toast or pancakes Crackers  Buttered toast Fruit juices or nectars  Noodles or pasta (no rice) Canned fruit  Potatoes (all types) Ripe bananas  Soft, cooked vegetables (no corn, lima, or baked beans) Peeled, ripe, fresh fruit  Creamed soups or vegetable soup Cakes (no nuts, dried fruit, coconut)  Canned chicken  noodle soup Plain doughnuts  Gravies Ice cream  Bacon dressing Pudding, mousse, custard  Sauces:  Cheese, creamed, barbecue, tomato, white Fruit ice, [place] ice, sherbet  Decaffeinated tea or coffee Whipped gelatin  Pork chops Regular gelatin  Canned fruited gelatin molds   Sugar, syrup, honey, jam, jelly   Cream   Non-dairy   Margarine   Oil   Mayonnaise   Ketchup   Mustard   TROUBLESHOOTING IRREGULAR BOWELS  1) Avoid extremes of bowel movements (no bad constipation/diarrhea)  2) Miralax 17gm mixed in 8oz. water or juice-daily. May use BID as needed.  3) Gas-x,Phazyme, etc. as needed for gas & bloating.  4) Soft,bland diet. No spicy,greasy,fried foods.  5) Prilosec over-the-counter as needed  6) May hold gluten/wheat products from diet to see if symptoms improve.  7) May try probiotics (Align, Activa, etc) to help calm the bowels down  7) If symptoms become worse call back immediately.    If you have any questions please call our office at [place]: [phone].  National Dysphagia Diet Pureed Nutrition Therapy  The purpose of this diet is to provide foods that can be successfully and safely swallowed. This diet consists of foods that are easy to swallow because they are blended, whipped, or mashed until they are a lump-free puree texture.  A registered dietitian nutritionist can individualize this diet to provide some favorite food items in a modified form.  Tips Puree all foods you eat. Foods should not have lumps or require any chewing. Add small amounts of gravy, sauce, vegetable juice or cooking water, fruit juice, milk, or half and half to foods, then puree them to the consistency of pudding. Add potato flakes or commercial thickeners to pureed foods that are too thin. Strain pureed or blended foods to remove lumps, chunks, pulp, or seeds. Add dry milk powder to food to increase the calories and protein in that food. Prepare quantities of favorite  food items and freeze them in portion sizes for use later. Reheat foods carefully so that a tough outer crust does not form on them.    Foods Recommended Food Group  Foods Recommended  Grains  Soft pancakes, breads, sweet rolls, [place] pastries, [place] toasts, muffins, bread dressing, that have been pureed to a pudding consistency. Commercial pureed bread mixes.  Well-cooked pasta, noodles, and rice that have been pureed into a pudding consistency.  Pureed oatmeal.  Smooth, cooked cereals (farina, cream of wheat) with small amounts of milk.  Soft, moist cakes with icing dissolved in milk or juice to form a slurry.  Cookies softened with milk, coffee, or other liquid to form a slurry.  Protein Foods  Pureed prepared, moistened red meat, beef, pork, or lamb. Gravy and sauce to add moisture.  Pureed prepared, moistened poultry, including chicken or [place]. Gravy and sauce to add moisture.  Pureed prepared, moistened seafood, including fish (salmon, herring, and sardines), shrimp, lobster, clams, and scallops. Gravy and sauce to add moisture.  Pureed prepared moistened ham, hot dogs and deli meats.  Pureed tuna, egg, or meat salad  Pureed poached, egg dishes such as scrambled, or soft-cooked eggs or egg substitute mashed with butter, margarine, sauce, or gravy. Pureed smooth souffls  Pureed smooth casseroles.  Pureed, moist macaroni and cheese, well-cooked pasta with meat sauce, tuna-noodle casserole, soft and moist lasagna.  Smooth, nut and seed butters, such as peanut butter, almond butter, and sunflower seed butter if pureed within recipe.  Pureed, prepared, moistened, and ground soy foods, such as tofu or tempeh  Pureed, prepared, moistened meat alternatives, such as veggie burgers, and sausages based on plant protein  Pureed, prepared, and moistened legumes, such as dried beans, lentils, or peas.  Dairy  Milk, plain yogurt (  without nuts or coconut), sour cream,  and whipped topping.  Fortified soymilk or milk are acceptable components to add moisture.  Pureed cottage cheese and cream cheese.  Frozen desserts made from milk such as smooth pudding, custard, ice cream, sherbet, malts, and frozen yogurt.   Vegetables  All pureed, cooked, canned or frozen, tender vegetables including dark-green, red and orange vegetables, legumes (beans and peas), and starchy vegetables. Tomato sauce or tomato paste without seeds.  Mashed or pureed potatoes without skins seasoned with gravy, butter, margarine, or sour cream.  Fruits  All soft, drained pureed fruits including canned and cooked fruits (without skins). Gelatin with canned fruit (except pineapple) that is pureed together.  Well-mashed banana or avocado; pureed fresh watermelon without seeds.  100% fruit juice in consistency recommended by a speech therapist.  Oils  Vegetable oils, including olive, peanut, and canola oils; margarines and spreads); salad dressing and mayonnaise, butter, strained gravy, cream sauces -white sauce, cheese sauce, hollandaise sauce (for the addition of moisture to foods).  Beverages  Coffee, tea, water, 100% fruit juice in the liquid consistency recommended by a speech therapist  Other  Puree prepared foods, including all blenderized and strained soups, casseroles, baked goods, and snacks made from recommended ingredients.  Smooth pureed sandwiches and pizza.  All finely ground seasonings and sweeteners, jelly  Pureed desserts, gelatin, popsicle and pudding pops.  Foods Not Recommended    Food Group  Foods Not Recommended  Grains  Breads, rolls, crackers, biscuits, pancakes, waffles, [place] toast, muffins, and bread dressing, pasta, noodles, and rice that have not been pureed to pudding consistency.  Dry cereals, oatmeal, or cooked cereals with lumps, seeds, or chunks.  Dry or coarse cakes, cookies, pies, pastries, bread or rice puddings, or coarse  puddings that have not been pureed to pudding consistency.  Breads and starches such as biscuits, frozen waffles, sweet breads, doughnuts, pastries, packaged baking mixes, pancakes, cakes, and cookies that have not been pureed to pudding consistency.   Protein Foods  Tough, dry, whole, or ground red meat (beef, pork, lamb).  Tough, dry, whole, or ground poultry (chicken and [place]).  Tough, dry, whole, or ground fried meats, poultry, or fish.  Whole or ground deli meats, such as pastrami, bologna, or salami (made of meat or poultry)  Tough, dry, whole, or ground processed meats, such as bacon, sausage, hot dogs and ham  Non-pureed fried, scrambled, or hard-cooked eggs  Non-pureed or dry casseroles.  Whole nuts or seeds. All nut butters if not pureed unless used in an existing recipe that is tolerated.  Whole, ground, processed meat alternatives  Dairy  Cheese slices or cubes.  Non-pureed yogurt or ice cream made with fruit pieces, nuts, or coconut.  All cheese except pureed cottage cheese or cheese used in a recipe that is pureed.   Vegetables  Any fresh, frozen, canned, or dried vegetables that have not been pureed  Non-pureed tomatoes or tomato sauce with seeds  Fried vegetables such as potato skins, potato chips or other crunchy vegetable chips, fried or french-fried potatoes, tough or crisp-fried potatoes.   Fruits  All non-pureed whole fresh, frozen, canned, or dried fruits that have not been pureed.  All pineapple, coconut, or watermelon with seeds.  Fruit leather, fruit roll-ups, fruit snacks, dried fruits.   Oils   All fats with coarse or chunky additives.  Beverages  Liquid consistencies other than those specified by a speech therapist.   Other  Soups that have lumps, chunks, and are  not pureed smooth.  Coarsely ground pepper or herbs.  Sweets that are not pureed; chunky fruit preserves or seeded jams  Sandwiches, pizza (that are not  pureed)  Nuts, seeds, or sticky foods. Candies with nuts, seeds, or coconut.  Chewy caramel, taffy-style candies, or licorice.  NDD Pureed Foods Sample 1-Day Menu  Breakfast 1/2 cup orange juice, at prescribed consistency 1/2 cup farina 1/4 cup low-fat milk, for farina 1/2 teaspoon brown sugar, lump-free for farina 1 pureed, scrambled egg 1/2 muffin, pureed 1 teaspoon butter, for muffin Lunch 1/2 cup pureed tomato soup, made with milk 3 pureed saltine crackers 1/2 cup mashed potatoes 1/4 cup gravy 1/2 cup pureed carrots and peas 1/2 cup vanilla pudding 1/2 cup pureed peaches Evening Meal 1/2 cup pureed potato soup made with milk 3 pureed saltine crackers 1/2 cup pureed green beans 1/2 cup pureed applesauce 6 oz smooth, whipped fruit-flavored yogurt  Copyright 2021  [employer]. All rights reserved.

## 2019-07-15 NOTE — Progress Notes (Signed)
Mercaptopurine (Purinethol) hold criteria: Hgb < 8 WBC < 3000 Pltc < 100K AST or ALT > 3x ULN Bili > 1.5x ULN   Mercaptopurine order dc'd for AST 193 and ALT 224  (> 3x UNL)  Eudelia Bunch, Pharm.D 07/15/2019 10:10 AM

## 2019-07-16 NOTE — Plan of Care (Signed)

## 2019-07-16 NOTE — Progress Notes (Signed)
Nutrition Education Note  RD working remotely.  RD consulted for nutrition education regarding pureed diet. Pt s/p lap Nissen.   Spoke with pt over phone, who is pleasant and in good spirits today. She confirmed that she tolerated pureed diet well and is preparing to discharge home today. Pt reports she was working on a grocery list to prepare for her pureed diet at home.   RD provided "National Dysphagia Diet Pureed Nutrition Therapy" handout from the Academy of Nutrition and Dietetics. Reviewed patient's dietary recall. Provided examples on ways to modify foods to be of pureed/liquid consistency. Discouraged intake of solid or hard textured foods.  Discussed ways that pt could utilize a blender or food processor to achieve desired consistency. Encouraged increased protein intake and oral nutrition supplements/ use of protein powders added to foods, soups, and smoothies.  RD discussed why it is important for patient to adhere to diet recommendations. Discussed plan to continue with current diet until wires can be removed. Teach back method used.  Expect good compliance.  Body mass index is 30.34 kg/m. Pt meets criteria for obesity, class I based on current BMI.  Current diet order is dysphagia 1, patient is consuming approximately 100% of meals at this time. Labs and medications reviewed. No further nutrition interventions warranted at this time. RD contact information provided. If additional nutrition issues arise, please re-consult RD.   Jorey Dollard A. Jimmye Norman, RD, LDN, [place] Registered Dietitian II Certified Diabetes Care and Education Specialist Pager: [phone] After hours Pager: [phone]

## 2019-07-16 NOTE — Discharge Summary (Signed)
Physician Discharge Summary  Patient ID: Caroline Dean MRN: [id number] DOB/AGE: 1941/01/14 79 y.o.  Admit date: 07/14/2019 Discharge date: 07/16/2019  Admission Diagnoses: hiatal hernia  Discharge Diagnoses:  Active Problems:   S/P Nissen fundoplication (without gastrostomy tube) procedure   Discharged Condition: good  Hospital Course: Pt admitted after surgery.  UGI performed on POD 1.  Showed a tight wrap with delayed emptying and no leak.  Pt did well with clears but had trouble swallowing pureed diet.  She was started on IV decadron and continued with clears.  On POD 2 she was swallowing better and she was advanced again to pureed diet.  She did well with this and was discharged to continue this at home.  Consults: None  Significant Diagnostic Studies: labs: cbc, bmet  Treatments: surgery: robotic [facility] repair  Discharge Exam: Blood pressure (!) 150/72, pulse 75, temperature 97.9 F (36.6 C), temperature source Oral, resp. rate 18, height _0  (1.676 m), weight 85.3 kg, SpO2 94 %. General appearance: alert and cooperative GI: soft, nondistended Incision/Wound: clean, dry, intact  Disposition: Discharge disposition: 01-Home or Self Care        Allergies as of 07/16/2019      Reactions   Codeine Nausea And Vomiting      Medication List    TAKE these medications   ALPRAZolam 0.25 MG tablet Commonly known as: XANAX Take 0.25 mg by mouth daily as needed for anxiety.   amLODipine 2.5 MG tablet Commonly known as: NORVASC Take 2.5 mg by mouth daily.   aspirin 81 MG EC tablet Take 81 mg by mouth daily.   Cyanocobalamin 1000 MCG/ML Kit Inject as directed every 30 (thirty) days.   ferrous sulfate 325 (65 FE) MG tablet Take 325 mg by mouth 3 (three) times a week.   folic acid 1 MG tablet Commonly known as: FOLVITE Take 1 tablet (1 mg total) by mouth daily.   HYDROcodone-acetaminophen 7.5-325 mg/15 ml solution Commonly known as: HYCET Take 15 mLs by mouth 4  (four) times daily as needed for moderate pain.   mercaptopurine 50 MG tablet Commonly known as: PURINETHOL Take 1 tablet (50 mg total) by mouth daily. Give on an empty stomach 1 hour before or 2 hours after meals. Caution: Chemotherapy.   metoprolol tartrate 50 MG tablet Commonly known as: LOPRESSOR Take 50 mg by mouth 2 (two) times daily.   omeprazole 20 MG capsule Commonly known as: PRILOSEC Take 1 capsule (20 mg total) by mouth 2 (two) times daily before a meal. What changed:   when to take this  additional instructions   simvastatin 20 MG tablet Commonly known as: ZOCOR Take 20 mg by mouth daily.   sodium chloride 0.65 % Soln nasal spray Commonly known as: OCEAN Place 1 spray into both nostrils as needed for congestion.   sulfaSALAzine 500 MG tablet Commonly known as: AZULFIDINE Take 2 tablets (1,000 mg total) by mouth 2 (two) times daily.   Vitamin D-1000 Max St 25 MCG (1000 UT) tablet Generic drug: Cholecalciferol Take 3,000 Units by mouth daily.      Follow-up Information    Ralene Ok, MD. Schedule an appointment as soon as possible for a visit in 2 weeks.   Specialty: General Surgery Why: Post op visit Contact information: [place] [place] [address] [phone]           Signed: Rosario Adie 6/65/9935, 7:31 AM

## 2019-08-07 ENCOUNTER — Telehealth: Payer: Self-pay | Admitting: *Deleted

## 2019-08-07 MED ORDER — SULFASALAZINE 500 MG PO TABS: 1000.0000 mg | ORAL_TABLET | Freq: Two times a day (BID) | ORAL | 12 refills | Status: DC

## 2019-08-07 NOTE — Telephone Encounter (Signed)
Sent refills of Sulfasalazine to pharmacy per fax request from pharmacy

## 2019-10-06 ENCOUNTER — Other Ambulatory Visit: Payer: Self-pay

## 2019-10-10 ENCOUNTER — Encounter: Payer: Self-pay | Admitting: Gastroenterology

## 2019-10-10 ENCOUNTER — Other Ambulatory Visit (INDEPENDENT_AMBULATORY_CARE_PROVIDER_SITE_OTHER): Payer: Medicare Other

## 2019-10-10 ENCOUNTER — Ambulatory Visit: Payer: Medicare Other | Admitting: Gastroenterology

## 2019-10-10 VITALS — BP 160/94 | HR 64 | Temp 98.5°F | Ht 65.5 in | Wt 178.5 lb

## 2019-10-10 DIAGNOSIS — R7989 Other specified abnormal findings of blood chemistry: Secondary | ICD-10-CM

## 2019-10-10 DIAGNOSIS — Z9889 Other specified postprocedural states: Secondary | ICD-10-CM

## 2019-10-10 DIAGNOSIS — K219 Gastro-esophageal reflux disease without esophagitis: Secondary | ICD-10-CM

## 2019-10-10 DIAGNOSIS — R945 Abnormal results of liver function studies: Secondary | ICD-10-CM

## 2019-10-10 DIAGNOSIS — K5130 Ulcerative (chronic) rectosigmoiditis without complications: Secondary | ICD-10-CM

## 2019-10-10 DIAGNOSIS — K51919 Ulcerative colitis, unspecified with unspecified complications: Secondary | ICD-10-CM

## 2019-10-10 LAB — CBC WITH DIFFERENTIAL/PLATELET
Basophils Absolute: 0.1 10*3/uL (ref 0.0–0.1)
Basophils Relative: 1.6 % (ref 0.0–3.0)
Eosinophils Absolute: 0.1 10*3/uL (ref 0.0–0.7)
Eosinophils Relative: 2.7 % (ref 0.0–5.0)
HCT: 38.4 % (ref 36.0–46.0)
Hemoglobin: 12.6 g/dL (ref 12.0–15.0)
Lymphocytes Relative: 18.9 % (ref 12.0–46.0)
Lymphs Abs: 1 10*3/uL (ref 0.7–4.0)
MCHC: 32.7 g/dL (ref 30.0–36.0)
MCV: 101.1 fl — ABNORMAL HIGH (ref 78.0–100.0)
Monocytes Absolute: 0.6 10*3/uL (ref 0.1–1.0)
Monocytes Relative: 11.8 % (ref 3.0–12.0)
Neutro Abs: 3.5 10*3/uL (ref 1.4–7.7)
Neutrophils Relative %: 65 % (ref 43.0–77.0)
Platelets: 228 10*3/uL (ref 150.0–400.0)
RBC: 3.8 Mil/uL — ABNORMAL LOW (ref 3.87–5.11)
RDW: 15 % (ref 11.5–15.5)
WBC: 5.5 10*3/uL (ref 4.0–10.5)

## 2019-10-10 LAB — COMPREHENSIVE METABOLIC PANEL
ALT: 20 U/L (ref 0–35)
AST: 21 U/L (ref 0–37)
Albumin: 4.2 g/dL (ref 3.5–5.2)
Alkaline Phosphatase: 67 U/L (ref 39–117)
BUN: 12 mg/dL (ref 6–23)
CO2: 26 mEq/L (ref 19–32)
Calcium: 9.1 mg/dL (ref 8.4–10.5)
Chloride: 107 mEq/L (ref 96–112)
Creatinine, Ser: 0.71 mg/dL (ref 0.40–1.20)
GFR: 79.65 mL/min (ref >60.00)
Glucose, Bld: 101 mg/dL — ABNORMAL HIGH (ref 70–99)
Potassium: 4.3 mEq/L (ref 3.5–5.1)
Sodium: 142 mEq/L (ref 135–145)
Total Bilirubin: 0.9 mg/dL (ref 0.2–1.2)
Total Protein: 6.9 g/dL (ref 6.0–8.3)

## 2019-10-10 LAB — SEDIMENTATION RATE: Sed Rate: 11 mm/hr (ref 0–30)

## 2019-10-10 LAB — C-REACTIVE PROTEIN: CRP: <1 mg/dL (ref 0.5–20.0)

## 2019-10-10 NOTE — Patient Instructions (Signed)
If you are age 78 or older, your body mass index should be between 23-30. Your Body mass index is 29.25 kg/m. If this is out of the aforementioned range listed, please consider follow up with your Primary Care Provider.  If you are age 12 or younger, your body mass index should be between 19-25. Your Body mass index is 29.25 kg/m. If this is out of the aformentioned range listed, please consider follow up with your Primary Care Provider.   Your provider has requested that you go to the basement level for lab work before leaving today. Press "B" on the elevator. The lab is located at the first door on the left as you exit the elevator.  Due to recent changes in healthcare laws, you may see the results of your imaging and laboratory studies on MyChart before your provider has had a chance to review them.  We understand that in some cases there may be results that are confusing or concerning to you. Not all laboratory results come back in the same time frame and the provider may be waiting for multiple results in order to interpret others.  Please give [place] 48 hours in order for your provider to thoroughly review all the results before contacting the office for clarification of your results.   You have been scheduled for a colonoscopy. Please follow written instructions given to you at your visit today.   Please start taking 1 capful of Miralax 5 - 7 days prior to colonoscopy.  I appreciate the opportunity to care for you. Thank you for choosing me and [facility],  Dr. Harl Bowie

## 2019-10-10 NOTE — Progress Notes (Signed)
Caroline Dean    [id number]    11-18-1940  Primary Care Physician:Grisso, Clarita Crane., MD  Referring Physician: [name], MD [place],  [place] [address]   Chief complaint: Abdominal discomfort, Ulcerative colitis, nausea  HPI:  79 year old female here for follow-up visit s/p Nissen fundoplication for large symptomatic hiatal hernia on July 14, 2019 Overall feeling better, is able to swallow and eat better She tried to discontinue omeprazole but felt acid taste in her throat and also had nausea. She hasn't been able to stop the PPI  Hepatic Function Latest Ref Rng & Units 10/10/2019 07/15/2019 06/03/2011  Total Protein 6.0 - 8.3 g/dL 6.9 6.4(L) 7.4  Albumin 3.5 - 5.2 g/dL 4.2 3.6 4.1  AST 0 - 37 U/L 21 193(H) 18  ALT 0 - 35 U/L 20 224(H) 20  Alk Phosphatase 39 - 117 U/L 67 54 67  Total Bilirubin 0.2 - 1.2 mg/dL 0.9 0.8 0.5  Bilirubin, Direct 0.0 - 0.3 mg/dL - - -    She is taking 6-MP and sulfasalazine She is having intermittent generalized abdominal discomfort and also intermittent diarrhea on average once or twice a week.  Worried about UC flare  Last colonoscopy by Dr. [name] 2 years ago, is not available for review during this visit. She has had multiple surveillance colonoscopies by Dr. [name], most recent in November 2013 showed mild colitis 40 to 70 cm and moderately severe colitis in rectum and sigmoid colon.  Outpatient Encounter Medications as of 10/10/2019  Medication Sig  . ALPRAZolam (XANAX) 0.25 MG tablet Take 0.25 mg by mouth daily as needed for anxiety.   Marland Kitchen amLODipine (NORVASC) 2.5 MG tablet Take 2.5 mg by mouth daily.   Marland Kitchen aspirin 81 MG EC tablet Take 81 mg by mouth daily.   . Cholecalciferol (VITAMIN D-1000 MAX ST) 1000 units tablet Take 3,000 Units by mouth daily.   . Cyanocobalamin 1000 MCG/ML KIT Inject as directed every 30 (thirty) days.  . ferrous sulfate 325 (65 FE) MG tablet Take 325 mg by mouth 3 (three) times a  week.   . folic acid (FOLVITE) 1 MG tablet Take 1 tablet (1 mg total) by mouth daily.  . mercaptopurine (PURINETHOL) 50 MG tablet Take 1 tablet (50 mg total) by mouth daily. Give on an empty stomach 1 hour before or 2 hours after meals. Caution: Chemotherapy.  . metoprolol tartrate (LOPRESSOR) 50 MG tablet Take 50 mg by mouth 2 (two) times daily.   Marland Kitchen omeprazole (PRILOSEC) 20 MG capsule Take 1 capsule (20 mg total) by mouth 2 (two) times daily before a meal. (Patient taking differently: Take 20 mg by mouth daily. Take an additional if needed for heart burn)  . simvastatin (ZOCOR) 20 MG tablet Take 20 mg by mouth daily.   . sodium chloride (OCEAN) 0.65 % SOLN nasal spray Place 1 spray into both nostrils as needed for congestion.  . sulfaSALAzine (AZULFIDINE) 500 MG tablet Take 2 tablets (1,000 mg total) by mouth 2 (two) times daily.  Marland Kitchen HYDROcodone-acetaminophen (HYCET) 7.5-325 mg/15 ml solution Take 15 mLs by mouth 4 (four) times daily as needed for moderate pain. (Patient not taking: Reported on 10/10/2019)  . [DISCONTINUED] ALPRAZolam (XANAX) 0.25 MG tablet Take by mouth.  . [DISCONTINUED] amLODipine (NORVASC) 5 MG tablet TAKE 1 TABLET BY MOUTH ONCE DAILY  . [DISCONTINUED] HYDROcodone-acetaminophen (HYCET) 7.5-325 mg/15 ml solution Take by mouth.  . [DISCONTINUED] metoprolol tartrate (LOPRESSOR)  50 MG tablet TAKE 1 TABLET BY MOUTH TWICE DAILY.   No facility-administered encounter medications on file as of 10/10/2019.    Allergies as of 10/10/2019 - Review Complete 10/10/2019  Allergen Reaction Noted  . Codeine Nausea And Vomiting 05/10/2008    Past Medical History:  Diagnosis Date  . Diverticulosis   . Fatty liver   . GERD (gastroesophageal reflux disease)   . Hyperlipemia   . Hyperplastic colon polyp   . Hypertension   . Internal hemorrhoids   . Iron deficiency anemia, unspecified   . Osteoarthritis   . Ulcerative colitis   . Vitamin B12 deficiency     Past Surgical History:    Procedure Laterality Date  . BIOPSY  06/19/2019   Procedure: BIOPSY;  Surgeon: Mauri Pole, MD;  Location: [facility];  Service: Endoscopy;;  . COLONOSCOPY    . DILATION AND CURETTAGE OF UTERUS    . ESOPHAGEAL MANOMETRY N/A 06/19/2019   Procedure: ESOPHAGEAL MANOMETRY (EM);  Surgeon: Mauri Pole, MD;  Location: [facility];  Service: Endoscopy;  Laterality: N/A;  . ESOPHAGOGASTRODUODENOSCOPY (EGD) WITH PROPOFOL N/A 06/19/2019   Procedure: ESOPHAGOGASTRODUODENOSCOPY (EGD) WITH PROPOFOL;  Surgeon: Mauri Pole, MD;  Location: [facility];  Service: Endoscopy;  Laterality: N/A;  with manometry probe placement  . TONSILLECTOMY AND ADENOIDECTOMY      Family History  Problem Relation Age of Onset  . Lymphoma Brother   . Diabetes Mother   . Heart disease Father   . Cirrhosis Other        neice with liver transplant  . Colon cancer Neg Hx     Social History   Socioeconomic History  . Marital status: Single    Spouse name: Not on file  . Number of children: 0  . Years of education: Not on file  . Highest education level: Not on file  Occupational History  . Occupation: retired  Tobacco Use  . Smoking status: Never Smoker  . Smokeless tobacco: Never Used  Substance and Sexual Activity  . Alcohol use: Yes    Comment: occasional  . Drug use: No  . Sexual activity: Not on file  Other Topics Concern  . Not on file  Social History Narrative  . Not on file   Social Determinants of Health   Financial Resource Strain:   . Difficulty of Paying Living Expenses:   Food Insecurity:   . Worried About Charity fundraiser in the Last Year:   . Arboriculturist in the Last Year:   Transportation Needs:   . Film/video editor (Medical):   Marland Kitchen Lack of Transportation (Non-Medical):   Physical Activity:   . Days of Exercise per Week:   . Minutes of Exercise per Session:   Stress:   . Feeling of Stress :   Social Connections:   . Frequency of Communication  with Friends and Family:   . Frequency of Social Gatherings with Friends and Family:   . Attends Religious Services:   . Active Member of Clubs or Organizations:   . Attends Archivist Meetings:   Marland Kitchen Marital Status:   Intimate Partner Violence:   . Fear of Current or Ex-Partner:   . Emotionally Abused:   Marland Kitchen Physically Abused:   . Sexually Abused:       Review of systems: All other review of systems negative except as mentioned in the HPI.   Physical Exam: Vitals:   10/10/19 1033  BP: (!) 160/94  Pulse: 64  Temp: 98.5 F (36.9 C)   Body mass index is 29.25 kg/m. Gen:      No acute distress Abd:     soft, non-tender; no palpable masses, no distension Neuro: alert and oriented x 3 Psych: normal mood and affect  Data Reviewed:  Reviewed labs, radiology imaging, old records and pertinent past GI work up   Assessment and Plan/Recommendations:  79 year old female with history of left-sided ulcerative colitis, diagnosed in 71 in clinical remission on 6-MP and sulfasalazine  Intermittent diarrhea and generalized abdominal discomfort Will need to exclude UC flare Check CBC, CMP and ESR/CRP  Due for surveillance colonoscopy, will schedule colonoscopy with biopsies to assess disease activity and also exclude dysplasia  If no evidence of active inflammation will consider to taper 6-MP off  Abnormal LFT with elevated transaminases postoperative,  Recheck LFT  The risks and benefits as well as alternatives of endoscopic procedure(s) have been discussed and reviewed. All questions answered. The patient agrees to proceed.  Intermittent nausea, GERD s/p hiatal hernia repair with Nissen fundoplication Possible rebound GERD symptoms when she stops PPI Will transition to Pepcid 20 mg daily and okay to use Pepcid as needed for breakthrough heartburn Continue antireflux measures  The patient was provided an opportunity to ask questions and all were answered. The patient  agreed with the plan and demonstrated an understanding of the instructions.  Damaris Hippo , MD    CC: [name], MD

## 2019-11-21 ENCOUNTER — Other Ambulatory Visit: Payer: Self-pay

## 2019-11-21 MED ORDER — MERCAPTOPURINE 50 MG PO TABS: 50.0000 mg | ORAL_TABLET | Freq: Every day | ORAL | 5 refills | Status: DC

## 2019-11-21 NOTE — Telephone Encounter (Signed)
Mercaptopurine refilled as pharmacy requested. Faxed rx to pharmacy as it printed.

## 2019-11-27 ENCOUNTER — Other Ambulatory Visit: Payer: Self-pay

## 2019-11-27 ENCOUNTER — Ambulatory Visit (AMBULATORY_SURGERY_CENTER): Payer: Self-pay | Admitting: Gastroenterology

## 2019-11-27 ENCOUNTER — Encounter: Payer: Self-pay | Admitting: Gastroenterology

## 2019-11-27 VITALS — BP 151/100 | HR 124 | Temp 96.6°F | Resp 24 | Ht 65.5 in | Wt 178.0 lb

## 2019-11-27 DIAGNOSIS — K51319 Ulcerative (chronic) rectosigmoiditis with unspecified complications: Secondary | ICD-10-CM

## 2019-11-27 DIAGNOSIS — I4891 Unspecified atrial fibrillation: Secondary | ICD-10-CM

## 2019-11-27 DIAGNOSIS — I272 Pulmonary hypertension, unspecified: Secondary | ICD-10-CM | POA: Diagnosis not present

## 2019-11-27 DIAGNOSIS — E876 Hypokalemia: Secondary | ICD-10-CM

## 2019-11-27 DIAGNOSIS — I340 Nonrheumatic mitral (valve) insufficiency: Secondary | ICD-10-CM

## 2019-11-27 DIAGNOSIS — I10 Essential (primary) hypertension: Secondary | ICD-10-CM

## 2019-11-27 DIAGNOSIS — I361 Nonrheumatic tricuspid (valve) insufficiency: Secondary | ICD-10-CM | POA: Diagnosis not present

## 2019-11-27 DIAGNOSIS — E785 Hyperlipidemia, unspecified: Secondary | ICD-10-CM | POA: Diagnosis not present

## 2019-11-27 MED ORDER — SODIUM CHLORIDE 0.9 % IV SOLN: 500.0000 mL | Freq: Once | INTRAVENOUS | Status: DC

## 2019-11-27 NOTE — Progress Notes (Signed)
Procedure cancelled today d/t new finding of Afib with RVR. Patient agreeable to go directly to ED in [place] per instruction from Dr. Silverio Decamp. Declined ambulance transport. RN spoke with Care partner who is agreeable to take pt directly to the ED in [place]. Pt asymptomatic at time of departure from this facility.

## 2019-11-27 NOTE — Progress Notes (Signed)
After patient was placed on monitor, noted abnormal heart rhythm with A. fib.  Heart rate 120-140s Blood pressure 166/107  Discussed the findings of new A. fib with rapid ventricular response and elevated blood pressure.  Plan to send patient to ER for evaluation and further management.  Patient wants to go to ER in [place] given it is closer to home.  We will reschedule office visit in 6 to 8 weeks, plan to reschedule colonoscopy once she is cleared by cardiology  K. Denzil Magnuson , MD [phone]

## 2019-11-28 DIAGNOSIS — I4891 Unspecified atrial fibrillation: Secondary | ICD-10-CM | POA: Diagnosis not present

## 2019-11-28 DIAGNOSIS — I10 Essential (primary) hypertension: Secondary | ICD-10-CM | POA: Diagnosis not present

## 2019-11-29 ENCOUNTER — Telehealth: Payer: Self-pay | Admitting: *Deleted

## 2019-11-29 NOTE — Telephone Encounter (Signed)
  Follow up Call-  Call back number 11/27/2019  Post procedure Call Back phone  # [phone]  Permission to leave phone message Yes  Some recent data might be hidden     Patient questions:  Do you have a fever, pain , or abdominal swelling? No. Pain Score  0 *  Have you tolerated food without any problems? Yes.    Have you been able to return to your normal activities? Yes.    Do you have any questions about your discharge instructions: Diet   No. Medications  No. Follow up visit  No.  Do you have questions or concerns about your Care? No.  Actions: * If pain score is 4 or above: 1. No action needed, pain <4.Have you developed a fever since your procedure? no  2.   Have you had an respiratory symptoms (SOB or cough) since your procedure? no  3.   Have you tested positive for COVID 19 since your procedure no  4.   Have you had any family members/close contacts diagnosed with the COVID 19 since your procedure?  no   If yes to any of these questions please route to Joylene John, RN and Erenest Rasher, RN

## 2019-12-13 DIAGNOSIS — I4891 Unspecified atrial fibrillation: Secondary | ICD-10-CM | POA: Insufficient documentation

## 2019-12-13 HISTORY — DX: Unspecified atrial fibrillation: I48.91

## 2019-12-14 ENCOUNTER — Other Ambulatory Visit: Payer: Self-pay

## 2019-12-14 ENCOUNTER — Encounter: Payer: Self-pay | Admitting: Cardiology

## 2019-12-14 ENCOUNTER — Ambulatory Visit: Payer: Medicare Other | Admitting: Cardiology

## 2019-12-14 VITALS — BP 148/98 | HR 98 | Ht 65.5 in | Wt 176.0 lb

## 2019-12-14 DIAGNOSIS — I4819 Other persistent atrial fibrillation: Secondary | ICD-10-CM | POA: Diagnosis not present

## 2019-12-14 DIAGNOSIS — E782 Mixed hyperlipidemia: Secondary | ICD-10-CM | POA: Diagnosis not present

## 2019-12-14 DIAGNOSIS — Z9889 Other specified postprocedural states: Secondary | ICD-10-CM

## 2019-12-14 DIAGNOSIS — I10 Essential (primary) hypertension: Secondary | ICD-10-CM

## 2019-12-14 HISTORY — DX: Essential (primary) hypertension: I10

## 2019-12-14 NOTE — Progress Notes (Signed)
Cardiology Office Note:    Date:  12/14/2019   ID:  Caroline Dean, DOB 07/14/1940, MRN [id number]  PCP:  [name], MD  Cardiologist:  Jenean Lindau, MD   Referring MD: [name], MD    ASSESSMENT:    1. S/P Nissen fundoplication (without gastrostomy tube) procedure   2. Essential hypertension   3. Persistent atrial fibrillation ([place])   4. Mixed dyslipidemia    PLAN:    In order of problems listed above:  1. Newly diagnosed atrial fibrillation:I discussed with the patient atrial fibrillation, disease process. Management and therapy including rate and rhythm control, anticoagulation benefits and potential risks were discussed extensively with the patient. Patient had multiple questions which were answered to patient's satisfaction. Patient has completed 2 weeks of anticoagulation and I'll set her up for cardioversion 2 weeks from now. Benefits and potential is explained to her and she vocalized understanding. Procedure was explained. Instructions were given to her in writing. She'll come for blood work the day before the cardioversion here at our office. 2. Essential hypertension: Blood pressure is stable and diet was emphasized. 3. Mixed dyslipidemia: On statin therapy diet was emphasized weight reduction was stressed and she promises to comply. 4. Patient will be seen in follow-up appointment in 1 month or earlier if the patient has any concerns.   Medication Adjustments/Labs and Tests Ordered: Current medicines are reviewed at length with the patient today.  Concerns regarding medicines are outlined above.  No orders of the defined types were placed in this encounter.  No orders of the defined types were placed in this encounter.    No chief complaint on file.    History of Present Illness:    Caroline Dean is a 79 y.o. female. Patient has history of essential hypertension and recently diagnosed with atrial fibrillation. She denies any problems at  this time and takes care of activities of daily living. She tells me that when she exerts herself she gets tired. No orthopnea or PND. She has easy fatigability. At the time of my evaluation, the patient is alert awake oriented and in no distress.  Past Medical History:  Diagnosis Date  . Cataract   . Diverticulosis   . Fatty liver   . GERD (gastroesophageal reflux disease)   . Hyperlipemia   . Hyperplastic colon polyp   . Hypertension   . Internal hemorrhoids   . Iron deficiency anemia, unspecified   . Osteoarthritis   . Tachycardia   . Ulcerative colitis   . Vitamin B12 deficiency     Past Surgical History:  Procedure Laterality Date  . BIOPSY  06/19/2019   Procedure: BIOPSY;  Surgeon: Mauri Pole, MD;  Location: [facility];  Service: Endoscopy;;  . COLONOSCOPY    . DILATION AND CURETTAGE OF UTERUS    . ESOPHAGEAL MANOMETRY N/A 06/19/2019   Procedure: ESOPHAGEAL MANOMETRY (EM);  Surgeon: Mauri Pole, MD;  Location: [facility];  Service: Endoscopy;  Laterality: N/A;  . ESOPHAGOGASTRODUODENOSCOPY (EGD) WITH PROPOFOL N/A 06/19/2019   Procedure: ESOPHAGOGASTRODUODENOSCOPY (EGD) WITH PROPOFOL;  Surgeon: Mauri Pole, MD;  Location: [facility];  Service: Endoscopy;  Laterality: N/A;  with manometry probe placement  . HIATAL HERNIA REPAIR     January 2021  . TONSILLECTOMY AND ADENOIDECTOMY      Current Medications: Current Meds  Medication Sig  . ALPRAZolam (XANAX) 0.25 MG tablet Take 0.25 mg by mouth daily as needed for anxiety.   Marland Kitchen amLODipine (NORVASC)  2.5 MG tablet Take 2.5 mg by mouth daily.   Marland Kitchen aspirin 81 MG EC tablet Take 81 mg by mouth daily.   . Cholecalciferol (VITAMIN D-1000 MAX ST) 1000 units tablet Take 3,000 Units by mouth daily.   . Cyanocobalamin 1000 MCG/ML KIT Inject as directed every 30 (thirty) days.  Marland Kitchen diltiazem (TIAZAC) 120 MG 24 hr capsule Take by mouth.  Arne [place] 5 MG TABS tablet Take 5 mg by mouth 2 (two) times daily.  .  ferrous sulfate 325 (65 FE) MG tablet Take 325 mg by mouth 3 (three) times a week.   . folic acid (FOLVITE) 1 MG tablet Take 1 tablet (1 mg total) by mouth daily.  . mercaptopurine (PURINETHOL) 50 MG tablet Take 1 tablet (50 mg total) by mouth daily. Give on an empty stomach 1 hour before or 2 hours after meals. Caution: Chemotherapy.  . metoprolol tartrate (LOPRESSOR) 50 MG tablet Take 50 mg by mouth 2 (two) times daily.   Marland Kitchen omeprazole (PRILOSEC) 20 MG capsule Take 1 capsule (20 mg total) by mouth 2 (two) times daily before a meal. (Patient taking differently: Take 20 mg by mouth daily. Take an additional if needed for heart burn)  . simvastatin (ZOCOR) 20 MG tablet Take 20 mg by mouth daily.   . sodium chloride (OCEAN) 0.65 % SOLN nasal spray Place 1 spray into both nostrils as needed for congestion.  . sulfaSALAzine (AZULFIDINE) 500 MG tablet Take 2 tablets (1,000 mg total) by mouth 2 (two) times daily.   Current Facility-Administered Medications for the 12/14/19 encounter (Office Visit) with Jaelan Rasheed, Reita Cliche, MD  Medication  . 0.9 %  sodium chloride infusion     Allergies:   Codeine   Social History   Socioeconomic History  . Marital status: Single    Spouse name: Not on file  . Number of children: 0  . Years of education: Not on file  . Highest education level: Not on file  Occupational History  . Occupation: retired  Tobacco Use  . Smoking status: Never Smoker  . Smokeless tobacco: Never Used  Substance and Sexual Activity  . Alcohol use: Yes    Comment: occasional  . Drug use: No  . Sexual activity: Not on file  Other Topics Concern  . Not on file  Social History Narrative  . Not on file   Social Determinants of Health   Financial Resource Strain:   . Difficulty of Paying Living Expenses:   Food Insecurity:   . Worried About Charity fundraiser in the Last Year:   . Arboriculturist in the Last Year:   Transportation Needs:   . Film/video editor (Medical):     Marland Kitchen Lack of Transportation (Non-Medical):   Physical Activity:   . Days of Exercise per Week:   . Minutes of Exercise per Session:   Stress:   . Feeling of Stress :   Social Connections:   . Frequency of Communication with Friends and Family:   . Frequency of Social Gatherings with Friends and Family:   . Attends Religious Services:   . Active Member of Clubs or Organizations:   . Attends Archivist Meetings:   Marland Kitchen Marital Status:      Family History: The patient's family history includes Cirrhosis in an other family member; Diabetes in her mother; Heart disease in her father; Lymphoma in her brother. There is no history of Colon cancer.  ROS:   Please see the  history of present illness.    All other systems reviewed and are negative.  EKGs/Labs/Other Studies Reviewed:    The following studies were reviewed today: EKG was not done but clinically she is in atrial fibrillation.   Recent Labs: 10/10/2019: ALT 20; BUN 12; Creatinine, Ser 0.71; Hemoglobin 12.6; Platelets 228.0; Potassium 4.3; Sodium 142  Recent Lipid Panel No results found for: CHOL, TRIG, HDL, CHOLHDL, VLDL, LDLCALC, LDLDIRECT  Physical Exam:    VS:  BP (!) 148/98   Pulse 98   Ht 5' 5.5" (1.664 m)   Wt 176 lb (79.8 kg)   SpO2 92%   BMI 28.84 kg/m     Wt Readings from Last 3 Encounters:  12/14/19 176 lb (79.8 kg)  11/27/19 178 lb (80.7 kg)  10/10/19 178 lb 8 oz (81 kg)     GEN: Patient is in no acute distress HEENT: Normal NECK: No JVD; No carotid bruits LYMPHATICS: No lymphadenopathy CARDIAC: Hear sounds regular, 2/6 systolic murmur at the apex. RESPIRATORY:  Clear to auscultation without rales, wheezing or rhonchi  ABDOMEN: Soft, non-tender, non-distended MUSCULOSKELETAL:  No edema; No deformity  SKIN: Warm and dry NEUROLOGIC:  Alert and oriented x 3 PSYCHIATRIC:  Normal affect   Signed, Jenean Lindau, MD  12/14/2019 11:28 AM    [facility]

## 2019-12-14 NOTE — Patient Instructions (Signed)
Medication Instructions:  Your physician has recommended you make the following change in your medication: Stop taking aspirin.  *If you need a refill on your cardiac medications before your next appointment, please call your pharmacy*   Lab Work: None ordered If you have labs (blood work) drawn today and your tests are completely normal, you will receive your results only by: Marland Kitchen MyChart Message (if you have MyChart) OR . A paper copy in the mail If you have any lab test that is abnormal or we need to change your treatment, we will call you to review the results.   Testing/Procedures: Your physician has recommended that you have a Cardioversion (DCCV). Electrical Cardioversion uses a jolt of electricity to your heart either through paddles or wired patches attached to your chest. This is a controlled, usually prescheduled, procedure. Defibrillation is done under light anesthesia in the hospital, and you usually go home the day of the procedure. This is done to get your heart back into a normal rhythm. You are not awake for the procedure. Please see the instruction sheet given to you today.  We will get this scheduled at [facility] for approximately 12/27/19.   Follow-Up: At [facility], you and your health needs are our priority.  As part of our continuing mission to provide you with exceptional heart care, we have created designated Provider Care Teams.  These Care Teams include your primary Cardiologist (physician) and Advanced Practice Providers (APPs -  Physician Assistants and Nurse Practitioners) who all work together to provide you with the care you need, when you need it.  We recommend signing up for the patient portal called "MyChart".  Sign up information is provided on this After Visit Summary.  MyChart is used to connect with patients for Virtual Visits (Telemedicine).  Patients are able to view lab/test results, encounter notes, upcoming appointments, etc.  Non-urgent  messages can be sent to your provider as well.   To learn more about what you can do with MyChart, go to [email or web address].    Your next appointment:   1 month(s)  The format for your next appointment:   In Person  Provider:   Jyl Heinz, MD   Other Instructions  Electrical Cardioversion Electrical cardioversion is the delivery of a jolt of electricity to restore a normal rhythm to the heart. A rhythm that is too fast or is not regular keeps the heart from pumping well. In this procedure, sticky patches or metal paddles are placed on the chest to deliver electricity to the heart from a device. This procedure may be done in an emergency if:  There is low or no blood pressure as a result of the heart rhythm.  Normal rhythm must be restored as fast as possible to protect the brain and heart from further damage.  It may save a life. This may also be a scheduled procedure for irregular or fast heart rhythms that are not immediately life-threatening. Tell a health care provider about:  Any allergies you have.  All medicines you are taking, including vitamins, herbs, eye drops, creams, and over-the-counter medicines.  Any problems you or family members have had with anesthetic medicines.  Any blood disorders you have.  Any surgeries you have had.  Any medical conditions you have.  Whether you are pregnant or may be pregnant. What are the risks? Generally, this is a safe procedure. However, problems may occur, including:  Allergic reactions to medicines.  A blood clot that breaks free and travels to  other parts of your body.  The possible return of an abnormal heart rhythm within hours or days after the procedure.  Your heart stopping (cardiac arrest). This is rare. What happens before the procedure? Medicines  Your health care provider may have you start taking: ? Blood-thinning medicines (anticoagulants) so your blood does not clot as easily. ? Medicines to  help stabilize your heart rate and rhythm.  Ask your health care provider about: ? Changing or stopping your regular medicines. This is especially important if you are taking diabetes medicines or blood thinners. ? Taking medicines such as aspirin and ibuprofen. These medicines can thin your blood. Do not take these medicines unless your health care provider tells you to take them. ? Taking over-the-counter medicines, vitamins, herbs, and supplements. General instructions  Follow instructions from your health care provider about eating or drinking restrictions.  Plan to have someone take you home from the hospital or clinic.  If you will be going home right after the procedure, plan to have someone with you for 24 hours.  Ask your health care provider what steps will be taken to help prevent infection. These may include washing your skin with a germ-killing soap. What happens during the procedure?   An IV will be inserted into one of your veins.  Sticky patches (electrodes) or metal paddles may be placed on your chest.  You will be given a medicine to help you relax (sedative).  An electrical shock will be delivered. The procedure may vary among health care providers and hospitals. What can I expect after the procedure?  Your blood pressure, heart rate, breathing rate, and blood oxygen level will be monitored until you leave the hospital or clinic.  Your heart rhythm will be watched to make sure it does not change.  You may have some redness on the skin where the shocks were given. Follow these instructions at home:  Do not drive for 24 hours if you were given a sedative during your procedure.  Take over-the-counter and prescription medicines only as told by your health care provider.  Ask your health care provider how to check your pulse. Check it often.  Rest for 48 hours after the procedure or as told by your health care provider.  Avoid or limit your caffeine use as  told by your health care provider.  Keep all follow-up visits as told by your health care provider. This is important. Contact a health care provider if:  You feel like your heart is beating too quickly or your pulse is not regular.  You have a serious muscle cramp that does not go away. Get help right away if:  You have discomfort in your chest.  You are dizzy or you feel faint.  You have trouble breathing or you are short of breath.  Your speech is slurred.  You have trouble moving an arm or leg on one side of your body.  Your fingers or toes turn cold or blue. Summary  Electrical cardioversion is the delivery of a jolt of electricity to restore a normal rhythm to the heart.  This procedure may be done right away in an emergency or may be a scheduled procedure if the condition is not an emergency.  Generally, this is a safe procedure.  After the procedure, check your pulse often as told by your health care provider. This information is not intended to replace advice given to you by your health care provider. Make sure you discuss any questions you  have with your health care provider. Document Revised: 01/09/2019 Document Reviewed: 01/09/2019 Elsevier Patient Education  [place].

## 2019-12-21 ENCOUNTER — Telehealth: Payer: Self-pay

## 2019-12-21 NOTE — Telephone Encounter (Signed)
Pt aware that her cardioversion will be 12/26/19 at [facility]. [facility] will call her to give her a time. Pt will have pre cardioversion orders at the hospital. Pt aware to go have COVID test today and sent to [facility].

## 2019-12-26 ENCOUNTER — Encounter: Payer: Self-pay | Admitting: Cardiology

## 2019-12-26 ENCOUNTER — Ambulatory Visit: Payer: Medicare Other | Admitting: Cardiology

## 2019-12-26 DIAGNOSIS — I4819 Other persistent atrial fibrillation: Secondary | ICD-10-CM | POA: Diagnosis not present

## 2020-01-08 NOTE — Telephone Encounter (Signed)
Alden Benjamin with [facility] made aware that Dr. Geraldo Pitter is on vacation. She will follow up next week.

## 2020-01-08 NOTE — Telephone Encounter (Signed)
Alden Benjamin with [facility] is requesting records from cardioversion completed on 12/26/19 by Dr. Geraldo Pitter. She states a call may be returned to [phone] (ext#: [phone]).

## 2020-01-17 DIAGNOSIS — K5790 Diverticulosis of intestine, part unspecified, without perforation or abscess without bleeding: Secondary | ICD-10-CM | POA: Insufficient documentation

## 2020-01-17 DIAGNOSIS — H269 Unspecified cataract: Secondary | ICD-10-CM | POA: Insufficient documentation

## 2020-01-17 DIAGNOSIS — M1990 Unspecified osteoarthritis, unspecified site: Secondary | ICD-10-CM | POA: Insufficient documentation

## 2020-01-17 DIAGNOSIS — R000 Tachycardia, unspecified: Secondary | ICD-10-CM | POA: Insufficient documentation

## 2020-01-17 DIAGNOSIS — K760 Fatty (change of) liver, not elsewhere classified: Secondary | ICD-10-CM | POA: Insufficient documentation

## 2020-01-17 DIAGNOSIS — K219 Gastro-esophageal reflux disease without esophagitis: Secondary | ICD-10-CM | POA: Insufficient documentation

## 2020-01-17 DIAGNOSIS — K635 Polyp of colon: Secondary | ICD-10-CM | POA: Insufficient documentation

## 2020-01-18 ENCOUNTER — Other Ambulatory Visit: Payer: Self-pay

## 2020-01-18 ENCOUNTER — Ambulatory Visit (INDEPENDENT_AMBULATORY_CARE_PROVIDER_SITE_OTHER): Payer: Medicare Other | Admitting: Cardiology

## 2020-01-18 ENCOUNTER — Encounter: Payer: Self-pay | Admitting: Cardiology

## 2020-01-18 VITALS — BP 132/88 | HR 64 | Ht 66.0 in | Wt 176.2 lb

## 2020-01-18 DIAGNOSIS — I480 Paroxysmal atrial fibrillation: Secondary | ICD-10-CM

## 2020-01-18 DIAGNOSIS — E782 Mixed hyperlipidemia: Secondary | ICD-10-CM | POA: Diagnosis not present

## 2020-01-18 DIAGNOSIS — I10 Essential (primary) hypertension: Secondary | ICD-10-CM

## 2020-01-18 HISTORY — DX: Paroxysmal atrial fibrillation: I48.0

## 2020-01-18 NOTE — Progress Notes (Signed)
Cardiology Office Note:    Date:  01/18/2020   ID:  Caroline Dean, DOB [birth year], MRN [id number]  PCP:  [name], MD  Cardiologist:  Jenean Lindau, MD   Referring MD: [name], MD    ASSESSMENT:    1. Essential hypertension   2. Mixed hyperlipidemia   3. PAF (paroxysmal atrial fibrillation) (HCC)    PLAN:    In order of problems listed above:  1. Primary prevention stressed with the patient. Importance of compliance with diet medication stressed and vocalized understanding. 2. Paroxysmal atrial fibrillation:I discussed with the patient atrial fibrillation, disease process. Management and therapy including rate and rhythm control, anticoagulation benefits and potential risks were discussed extensively with the patient. Patient had multiple questions which were answered to patient's satisfaction. 3. Essential hypertension: Blood pressure is stable 4. Mixed dyslipidemia: Diet was emphasized lipids followed by primary care physician. 5. I told the patient to walk at least 30 minutes a day 5 days a week and she promises to do so. She is a very active lady. 6. Patient will be seen in follow-up appointment in 6 months or earlier if the patient has any concerns.    Medication Adjustments/Labs and Tests Ordered: Current medicines are reviewed at length with the patient today.  Concerns regarding medicines are outlined above.  No orders of the defined types were placed in this encounter.  No orders of the defined types were placed in this encounter.    No chief complaint on file.    History of Present Illness:    Caroline Dean is a 79 y.o. female. Patient has past medical history of paroxysmal atrial fibrillation post successful cardioversion, essential hypertension and dyslipidemia. She denies any problems at this time and takes care of activities of daily living. No chest pain orthopnea or PND. She is on anticoagulation. She is very happy with the  successful cardioversion and tells me that it has affected her quality of life in a significant way. She walks about a mile a day or more at times. At the time of my evaluation, the patient is alert awake oriented and in no distress.  Past Medical History:  Diagnosis Date  . Age-related osteoporosis without current pathological fracture 10/28/2015  . AGITATION 08/15/2007   Qualifier: Diagnosis of  By: Roxan Hockey, [place]    . Anxiety 10/28/2015  . Atrial fibrillation ([place]) 12/13/2019  . Cataract   . Diverticulosis   . 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/2007   Qualifier: Diagnosis of  By: Roxan Hockey, [place]    . Dyslipidemia 01/24/2015  . Dysphagia   . Essential hypertension 12/14/2019  . Fatty liver   . FATTY LIVER DISEASE 05/11/2008   Qualifier: Diagnosis of  By: [name] MD, Lowella Bandy   . Gastro-esophageal reflux disease with esophagitis 10/28/2015  . Gastroesophageal reflux disease 08/15/2007   Qualifier: Diagnosis of  By: Roxan Hockey, [place]    . GERD (gastroesophageal reflux disease)   . High risk medication use 10/28/2015  . Hyperlipemia   . Hyperplastic colon polyp   . Hypertension   . Internal hemorrhoids   . Iron deficiency anemia, unspecified   . Malaise and fatigue 10/28/2015  . Mixed hyperlipidemia 10/28/2015  . Nephrolithiasis 10/28/2015  . Osteoarthritis   . Palpitations 01/24/2015  . 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 Prediabetes 05/17/2019  . Primary osteoarthritis involving multiple joints 10/28/2015  . 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 Shortness of breath 01/24/2015  .  Tachycardia   . Ulcerative colitis   . Ulcerative colitis ([place]) 08/15/2007   Qualifier: Diagnosis of  By: Roxan Hockey, [place]    . Vitamin B12 deficiency   . Vitamin D deficiency 10/28/2015    Past Surgical History:  Procedure Laterality Date  . BIOPSY  06/19/2019   Procedure: BIOPSY;  Surgeon: Mauri Pole, MD;  Location: [facility];   Service: Endoscopy;;  . COLONOSCOPY    . DILATION AND CURETTAGE OF UTERUS    . ESOPHAGEAL MANOMETRY N/A 06/19/2019   Procedure: ESOPHAGEAL MANOMETRY (EM);  Surgeon: Mauri Pole, MD;  Location: [facility];  Service: Endoscopy;  Laterality: N/A;  . ESOPHAGOGASTRODUODENOSCOPY (EGD) WITH PROPOFOL N/A 06/19/2019   Procedure: ESOPHAGOGASTRODUODENOSCOPY (EGD) WITH PROPOFOL;  Surgeon: Mauri Pole, MD;  Location: [facility];  Service: Endoscopy;  Laterality: N/A;  with manometry probe placement  . HIATAL HERNIA REPAIR     January 2021  . TONSILLECTOMY AND ADENOIDECTOMY      Current Medications: Current Meds  Medication Sig  . ALPRAZolam (XANAX) 0.25 MG tablet Take 0.25 mg by mouth daily as needed for anxiety.   Marland Kitchen amLODipine (NORVASC) 2.5 MG tablet Take 2.5 mg by mouth daily.   . Cholecalciferol (VITAMIN D-1000 MAX ST) 1000 units tablet Take 3,000 Units by mouth daily.   . Cyanocobalamin 1000 MCG/ML KIT Inject as directed every 30 (thirty) days.  Marland Kitchen diltiazem (TIAZAC) 120 MG 24 hr capsule Take by mouth.  Arne Cleveland 5 MG TABS tablet Take 5 mg by mouth 2 (two) times daily.  . ferrous sulfate 325 (65 FE) MG tablet Take 325 mg by mouth 3 (three) times a week.   . folic acid (FOLVITE) 1 MG tablet Take 1 tablet (1 mg total) by mouth daily.  . mercaptopurine (PURINETHOL) 50 MG tablet Take 1 tablet (50 mg total) by mouth daily. Give on an empty stomach 1 hour before or 2 hours after meals. Caution: Chemotherapy.  . metoprolol tartrate (LOPRESSOR) 50 MG tablet Take 50 mg by mouth 2 (two) times daily.   Marland Kitchen omeprazole (PRILOSEC) 20 MG capsule Take 1 capsule (20 mg total) by mouth 2 (two) times daily before a meal. (Patient taking differently: Take 20 mg by mouth daily. Take an additional if needed for heart burn)  . simvastatin (ZOCOR) 20 MG tablet Take 20 mg by mouth daily.   . sodium chloride (OCEAN) 0.65 % SOLN nasal spray Place 1 spray into both nostrils as needed for congestion.  .  sulfaSALAzine (AZULFIDINE) 500 MG tablet Take 2 tablets (1,000 mg total) by mouth 2 (two) times daily.   Current Facility-Administered Medications for the 01/18/20 encounter (Office Visit) with Kayde Atkerson, Reita Cliche, MD  Medication  . 0.9 %  sodium chloride infusion     Allergies:   Codeine   Social History   Socioeconomic History  . Marital status: Single    Spouse name: Not on file  . Number of children: 0  . Years of education: Not on file  . Highest education level: Not on file  Occupational History  . Occupation: retired  Tobacco Use  . Smoking status: Never Smoker  . Smokeless tobacco: Never Used  Substance and Sexual Activity  . Alcohol use: Yes    Comment: occasional  . Drug use: No  . Sexual activity: Not on file  Other Topics Concern  . Not on file  Social History Narrative  . Not on file   Social Determinants of Health   Financial Resource  Strain:   . Difficulty of Paying Living Expenses:   Food Insecurity:   . Worried About Charity fundraiser in the Last Year:   . Arboriculturist in the Last Year:   Transportation Needs:   . Film/video editor (Medical):   Marland Kitchen Lack of Transportation (Non-Medical):   Physical Activity:   . Days of Exercise per Week:   . Minutes of Exercise per Session:   Stress:   . Feeling of Stress :   Social Connections:   . Frequency of Communication with Friends and Family:   . Frequency of Social Gatherings with Friends and Family:   . Attends Religious Services:   . Active Member of Clubs or Organizations:   . Attends Archivist Meetings:   Marland Kitchen Marital Status:      Family History: The patient's family history includes Cirrhosis in an other family member; Diabetes in her mother; Heart disease in her father; Lymphoma in her brother. There is no history of Colon cancer.  ROS:   Please see the history of present illness.    All other systems reviewed and are negative.  EKGs/Labs/Other Studies Reviewed:    The  following studies were reviewed today: I discussed my findings with the patient at length   Recent Labs: 10/10/2019: ALT 20; BUN 12; Creatinine, Ser 0.71; Hemoglobin 12.6; Platelets 228.0; Potassium 4.3; Sodium 142  Recent Lipid Panel No results found for: CHOL, TRIG, HDL, CHOLHDL, VLDL, LDLCALC, LDLDIRECT  Physical Exam:    VS:  BP (!) 132/88   Pulse 64   Ht 5' 6"  (1.676 m)   Wt 176 lb 3.2 oz (79.9 kg)   SpO2 94%   BMI 28.44 kg/m     Wt Readings from Last 3 Encounters:  01/18/20 176 lb 3.2 oz (79.9 kg)  12/14/19 176 lb (79.8 kg)  11/27/19 178 lb (80.7 kg)     GEN: Patient is in no acute distress HEENT: Normal NECK: No JVD; No carotid bruits LYMPHATICS: No lymphadenopathy CARDIAC: Hear sounds regular, 2/6 systolic murmur at the apex. RESPIRATORY:  Clear to auscultation without rales, wheezing or rhonchi  ABDOMEN: Soft, non-tender, non-distended MUSCULOSKELETAL:  No edema; No deformity  SKIN: Warm and dry NEUROLOGIC:  Alert and oriented x 3 PSYCHIATRIC:  Normal affect   Signed, Jenean Lindau, MD  01/18/2020 10:44 AM    [place]

## 2020-01-18 NOTE — Patient Instructions (Signed)
Medication Instructions:  Your physician recommends that you continue on your current medications as directed. Please refer to the Current Medication list given to you today.  *If you need a refill on your cardiac medications before your next appointment, please call your pharmacy*   Lab Work: None If you have labs (blood work) drawn today and your tests are completely normal, you will receive your results only by: Marland Kitchen MyChart Message (if you have MyChart) OR . A paper copy in the mail If you have any lab test that is abnormal or we need to change your treatment, we will call you to review the results.   Testing/Procedures: None   Follow-Up: At [facility], you and your health needs are our priority.  As part of our continuing mission to provide you with exceptional heart care, we have created designated Provider Care Teams.  These Care Teams include your primary Cardiologist (physician) and Advanced Practice Providers (APPs -  Physician Assistants and Nurse Practitioners) who all work together to provide you with the care you need, when you need it.  We recommend signing up for the patient portal called "MyChart".  Sign up information is provided on this After Visit Summary.  MyChart is used to connect with patients for Virtual Visits (Telemedicine).  Patients are able to view lab/test results, encounter notes, upcoming appointments, etc.  Non-urgent messages can be sent to your provider as well.   To learn more about what you can do with MyChart, go to [email or web address].    Your next appointment:   4 month(s)  The format for your next appointment:   In Person  Provider:   Jyl Heinz, MD   Other Instructions

## 2020-01-31 ENCOUNTER — Telehealth: Payer: Self-pay | Admitting: Gastroenterology

## 2020-01-31 DIAGNOSIS — R197 Diarrhea, unspecified: Secondary | ICD-10-CM

## 2020-01-31 NOTE — Telephone Encounter (Signed)
Orders are in for GI pathogen panel    She will come into the lab tomorrow to pick up kit

## 2020-01-31 NOTE — Telephone Encounter (Signed)
Dr Silverio Decamp Please advise

## 2020-01-31 NOTE — Telephone Encounter (Signed)
Please advise patient to come in for GI pathogen panel, will need to make sure there is no C. difficile or acute infection prior to sending in antibiotics so we know we are treating appropriately.  Thank you

## 2020-02-01 ENCOUNTER — Other Ambulatory Visit: Payer: Medicare Other

## 2020-02-01 DIAGNOSIS — R197 Diarrhea, unspecified: Secondary | ICD-10-CM

## 2020-02-02 ENCOUNTER — Other Ambulatory Visit: Payer: Medicare Other

## 2020-02-02 DIAGNOSIS — R197 Diarrhea, unspecified: Secondary | ICD-10-CM

## 2020-02-06 LAB — GI PROFILE, STOOL, PCR

## 2020-02-13 ENCOUNTER — Telehealth: Payer: Self-pay | Admitting: Gastroenterology

## 2020-02-13 NOTE — Telephone Encounter (Signed)
Ok thank you 

## 2020-03-29 ENCOUNTER — Other Ambulatory Visit: Payer: Self-pay

## 2020-03-29 MED ORDER — ELIQUIS 5 MG PO TABS: 5.0000 mg | ORAL_TABLET | Freq: Two times a day (BID) | ORAL | 5 refills | Status: DC

## 2020-03-29 MED ORDER — DILTIAZEM HCL ER BEADS 120 MG PO CP24: 120.0000 mg | ORAL_CAPSULE | Freq: Every day | ORAL | 6 refills | Status: DC

## 2020-03-29 NOTE — Telephone Encounter (Signed)
Refill request for Eliquis 5 mg tablet # 60 Sig: Take 1 tablet by mouth twice (2) daily

## 2020-03-29 NOTE — Telephone Encounter (Signed)
Prescription refill request for Eliquis received. Indication:  Atrial Fibrillation Last office visit: 12/2019 Revankar Scr: 0.71  09/2019 Age:  79 Weight:  79.9 kg  Prescription refilled

## 2020-03-29 NOTE — Addendum Note (Signed)
Addended by: Frederik Schmidt on: 03/29/2020 02:32 PM   Modules accepted: Orders

## 2020-04-11 ENCOUNTER — Ambulatory Visit: Payer: Medicare Other | Admitting: Gastroenterology

## 2020-04-11 ENCOUNTER — Other Ambulatory Visit (INDEPENDENT_AMBULATORY_CARE_PROVIDER_SITE_OTHER): Payer: Medicare Other

## 2020-04-11 ENCOUNTER — Encounter: Payer: Self-pay | Admitting: Gastroenterology

## 2020-04-11 VITALS — BP 150/84 | HR 68 | Ht 65.5 in | Wt 176.5 lb

## 2020-04-11 DIAGNOSIS — K219 Gastro-esophageal reflux disease without esophagitis: Secondary | ICD-10-CM

## 2020-04-11 DIAGNOSIS — R945 Abnormal results of liver function studies: Secondary | ICD-10-CM | POA: Diagnosis not present

## 2020-04-11 DIAGNOSIS — K5180 Other ulcerative colitis without complications: Secondary | ICD-10-CM

## 2020-04-11 DIAGNOSIS — R7989 Other specified abnormal findings of blood chemistry: Secondary | ICD-10-CM

## 2020-04-11 LAB — CBC WITH DIFFERENTIAL/PLATELET
Basophils Absolute: 0.1 10*3/uL (ref 0.0–0.1)
Basophils Relative: 1.3 % (ref 0.0–3.0)
Eosinophils Absolute: 0.1 10*3/uL (ref 0.0–0.7)
Eosinophils Relative: 1.4 % (ref 0.0–5.0)
HCT: 36.6 % (ref 36.0–46.0)
Hemoglobin: 12.1 g/dL (ref 12.0–15.0)
Lymphocytes Relative: 13.8 % (ref 12.0–46.0)
Lymphs Abs: 0.7 10*3/uL (ref 0.7–4.0)
MCHC: 32.9 g/dL (ref 30.0–36.0)
MCV: 101.4 fl — ABNORMAL HIGH (ref 78.0–100.0)
Monocytes Absolute: 0.4 10*3/uL (ref 0.1–1.0)
Monocytes Relative: 8.8 % (ref 3.0–12.0)
Neutro Abs: 3.8 10*3/uL (ref 1.4–7.7)
Neutrophils Relative %: 74.7 % (ref 43.0–77.0)
Platelets: 225 10*3/uL (ref 150.0–400.0)
RBC: 3.61 Mil/uL — ABNORMAL LOW (ref 3.87–5.11)
RDW: 15 % (ref 11.5–15.5)
WBC: 5.1 10*3/uL (ref 4.0–10.5)

## 2020-04-11 LAB — IBC + FERRITIN
Ferritin: 116.2 ng/mL (ref 10.0–291.0)
Iron: 102 ug/dL (ref 42–145)
Saturation Ratios: 35.5 % (ref 20.0–50.0)
Transferrin: 205 mg/dL — ABNORMAL LOW (ref 212.0–360.0)

## 2020-04-11 LAB — VITAMIN B12: Vitamin B-12: 565 pg/mL (ref 211–911)

## 2020-04-11 LAB — PROTIME-INR
INR: 1.5 ratio — ABNORMAL HIGH (ref 0.8–1.0)
Prothrombin Time: 16.5 s — ABNORMAL HIGH (ref 9.6–13.1)

## 2020-04-11 LAB — FOLATE: Folate: >24.8 ng/mL (ref >5.9)

## 2020-04-11 MED ORDER — FAMOTIDINE 40 MG PO TABS
40.0000 mg | ORAL_TABLET | Freq: Every day | ORAL | 6 refills | Status: DC
Start: 2020-04-11 — End: 2020-11-21

## 2020-04-11 MED ORDER — SULFASALAZINE 500 MG PO TABS
1000.0000 mg | ORAL_TABLET | Freq: Two times a day (BID) | ORAL | 12 refills | Status: DC
Start: 2020-04-11 — End: 2021-04-11

## 2020-04-11 NOTE — Patient Instructions (Signed)
Your provider has requested that you go to the basement level for lab work before leaving today. Press "B" on the elevator. The lab is located at the first door on the left as you exit the elevator.  Your recall colonoscopy will be in Jan/2022, We will mail you out a reminder letter  Taper 6 MP 25 mg daily for 1 month then STOP  We have sent Pepcid and Sulfasalazine to your pharmacy  Taper off omeprazole  Use Lactaid tablets as needed  Follow up in 4 months after colonoscopy   Lactose-Free Diet, Adult If you have lactose intolerance, you are not able to digest lactose. Lactose is a natural sugar found mainly in dairy milk and dairy products. You may need to avoid all foods and beverages that contain lactose. A lactose-free diet can help you do this. Which foods have lactose? Lactose is found in dairy milk and dairy products, such as:  Yogurt.  Cheese.  Butter.  Margarine.  Sour cream.  Cream.  Whipped toppings and nondairy creamers.  Ice cream and other dairy-based desserts. Lactose is also found in foods or products made with dairy milk or milk ingredients. To find out whether a food contains dairy milk or a milk ingredient, look at the ingredients list. Avoid foods with the statement "May contain milk" and foods that contain:  Milk powder.  Whey.  Curd.  Caseinate.  Lactose.  Lactalbumin.  Lactoglobulin. What are alternatives to dairy milk and foods made with milk products?  Lactose-free milk.  Soy milk with added calcium and vitamin D.  Almond milk, coconut milk, rice milk, or other nondairy milk alternatives with added calcium and vitamin D. Note that these are low in protein.  Soy products, such as soy yogurt, soy cheese, soy ice cream, and soy-based sour cream.  Other nut milk products, such as almond yogurt, almond cheese, cashew yogurt, cashew cheese, cashew ice cream, coconut yogurt, and coconut ice cream. What are tips for following this  plan?  Do not consume foods, beverages, vitamins, minerals, or medicines containing lactose. Read ingredient lists carefully.  Look for the words "lactose-free" on labels.  Use lactase enzyme drops or tablets as directed by your health care provider.  Use lactose-free milk or a milk alternative, such as soy milk or almond milk, for drinking and cooking.  Make sure you get enough calcium and vitamin D in your diet. A lactose-free eating plan can be lacking in these important nutrients.  Take calcium and vitamin D supplements as directed by your health care provider. Talk to your health care provider about supplements if you are not able to get enough calcium and vitamin D from food. What foods can I eat?  Fruits All fresh, canned, frozen, or dried fruits that are not processed with lactose. Vegetables All fresh, frozen, and canned vegetables without cheese, cream, or butter sauces. Grains Any that are not made with dairy milk or dairy products. Meats and other proteins Any meat, fish, poultry, and other protein sources that are not made with dairy milk or dairy products. Soy cheese and yogurt. Fats and oils Any that are not made with dairy milk or dairy products. Beverages Lactose-free milk. Soy, rice, or almond milk with added calcium and vitamin D. Fruit and vegetable juices. Sweets and desserts Any that are not made with dairy milk or dairy products. Seasonings and condiments Any that are not made with dairy milk or dairy products. Calcium Calcium is found in many foods that contain lactose  and is important for bone health. The amount of calcium you need depends on your age:  Adults younger than 50 years: 1,000 mg of calcium a day.  Adults older than 50 years: 1,200 mg of calcium a day. If you are not getting enough calcium, you may get it from other sources, including:  Orange juice with calcium added. There are 300-350 mg of calcium in 1 cup of orange  juice.  Calcium-fortified soy milk. There are 300-400 mg of calcium in 1 cup of calcium-fortified soy milk.  Calcium-fortified rice or almond milk. There are 300 mg of calcium in 1 cup of calcium-fortified rice or almond milk.  Calcium-fortified breakfast cereals. There are 100-1,000 mg of calcium in calcium-fortified breakfast cereals.  Spinach, cooked. There are 145 mg of calcium in  cup of cooked spinach.  Edamame, cooked. There are 130 mg of calcium in  cup of cooked edamame.  Collard greens, cooked. There are 125 mg of calcium in  cup of cooked collard greens.  Kale, frozen or cooked. There are 90 mg of calcium in  cup of cooked or frozen kale.  Almonds. There are 95 mg of calcium in  cup of almonds.  Broccoli, cooked. There are 60 mg of calcium in 1 cup of cooked broccoli. The items listed above may not be a complete list of recommended foods and beverages. Contact a dietitian for more options. What foods are not recommended? Fruits None, unless they are made with dairy milk or dairy products. Vegetables None, unless they are made with dairy milk or dairy products. Grains Any grains that are made with dairy milk or dairy products. Meats and other proteins None, unless they are made with dairy milk or dairy products. Dairy All dairy products, including milk, goat's milk, buttermilk, kefir, acidophilus milk, flavored milk, evaporated milk, condensed milk, dulce de [place], eggnog, yogurt, cheese, and cheese spreads. Fats and oils Any that are made with milk or milk products. Margarines and salad dressings that contain milk or cheese. Cream. Half and half. Cream cheese. Sour cream. Chip dips made with sour cream or yogurt. Beverages Hot chocolate. Cocoa with lactose. Instant iced teas. Powdered fruit drinks. Smoothies made with dairy milk or yogurt. Sweets and desserts Any that are made with milk or milk products. Seasonings and condiments Chewing gum that has lactose.  Spice blends if they contain lactose. Artificial sweeteners that contain lactose. Nondairy creamers. The items listed above may not be a complete list of foods and beverages to avoid. Contact a dietitian for more information. Summary  If you are lactose intolerant, it means that you have a hard time digesting lactose, a natural sugar found in milk and milk products.  Following a lactose-free diet can help you manage this condition.  Calcium is important for bone health and is found in many foods that contain lactose. Talk with your health care provider about other sources of calcium. This information is not intended to replace advice given to you by your health care provider. Make sure you discuss any questions you have with your health care provider. Document Revised: 07/06/2017 Document Reviewed: 07/06/2017 Elsevier Patient Education  [employer].   Due to recent changes in healthcare laws, you may see the results of your imaging and laboratory studies on MyChart before your provider has had a chance to review them.  We understand that in some cases there may be results that are confusing or concerning to you. Not all laboratory results come back in the  same time frame and the provider may be waiting for multiple results in order to interpret others.  Please give [place] 48 hours in order for your provider to thoroughly review all the results before contacting the office for clarification of your results.   I appreciate the  opportunity to care for you  Thank You   Harl Bowie , MD

## 2020-04-11 NOTE — Progress Notes (Signed)
Caroline Dean    [id number]    Nov 15, 1940  Primary Care Physician:Dean, Caroline Crane., MD  Referring Physician: Raina Dean., MD [name],  [name]   Chief complaint: Ulcerative colitis HPI: 79 year old very pleasant female with history of large hiatal hernia s/p Nissen fundoplication for follow-up visit for ulcerative proctosigmoiditis  Colonoscopy was canceled because she went into new onset A. fib, currently is rate controlled and was started on chronic anticoagulation.  Overall she is feeling well  She is taking 6MP sulfasalazine with no recent flares.  She has intermittent diarrhea, denies any blood in stool or rectal bleeding. No nausea, vomiting, dysphagia, odynophagia or abdominal pain  Last colonoscopy by Dr. Melina Dean approximately 3 years ago, is not available for review during this visit. She has had multiple surveillance colonoscopiesby Dr. Olevia Dean, most recent in November 2013 showed mild colitis 40 to 70 cm and moderately severe colitis in rectum and sigmoid colon  Outpatient Encounter Medications as of 04/11/2020  Medication Sig  . ALPRAZolam (XANAX) 0.25 MG tablet Take 0.25 mg by mouth daily as needed for anxiety.   Marland Kitchen amLODipine (NORVASC) 2.5 MG tablet Take 2.5 mg by mouth daily.   . Cholecalciferol (VITAMIN D-1000 MAX ST) 1000 units tablet Take 3,000 Units by mouth daily.   . Cyanocobalamin 1000 MCG/ML KIT Inject as directed every 30 (thirty) days.  Marland Kitchen diltiazem (TIAZAC) 120 MG 24 hr capsule Take 1 capsule (120 mg total) by mouth daily.  Marland Kitchen ELIQUIS 5 MG TABS tablet Take 1 tablet (5 mg total) by mouth 2 (two) times daily.  . ferrous sulfate 325 (65 FE) MG tablet Take 325 mg by mouth 3 (three) times a week.   . folic acid (FOLVITE) 1 MG tablet Take 1 tablet (1 mg total) by mouth daily.  . mercaptopurine (PURINETHOL) 50 MG tablet Take 1 tablet (50 mg total) by mouth daily. Give on an empty stomach 1 hour before or 2 hours after meals.  Caution: Chemotherapy.  . metoprolol tartrate (LOPRESSOR) 50 MG tablet Take 50 mg by mouth 2 (two) times daily.   Marland Kitchen omeprazole (PRILOSEC) 20 MG capsule Take 1 capsule (20 mg total) by mouth 2 (two) times daily before a meal. (Patient taking differently: Take 20 mg by mouth daily. Take an additional if needed for heart burn)  . simvastatin (ZOCOR) 20 MG tablet Take 20 mg by mouth daily.   . sodium chloride (OCEAN) 0.65 % SOLN nasal spray Place 1 spray into both nostrils as needed for congestion.  . sulfaSALAzine (AZULFIDINE) 500 MG tablet Take 2 tablets (1,000 mg total) by mouth 2 (two) times daily.   Facility-Administered Encounter Medications as of 04/11/2020  Medication  . 0.9 %  sodium chloride infusion    Allergies as of 04/11/2020 - Review Complete 04/11/2020  Allergen Reaction Noted  . Codeine Nausea And Vomiting 05/10/2008    Past Medical History:  Diagnosis Date  . Age-related osteoporosis without current pathological fracture 10/28/2015  . AGITATION 08/15/2007   Qualifier: Diagnosis of  By: Roxan Hockey, [place]    . Anxiety 10/28/2015  . Atrial fibrillation ([facility]) 12/13/2019  . Cataract   . Diverticulosis   . 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/2007   Qualifier: Diagnosis of  By: Roxan Hockey, Norchel    . Dyslipidemia 01/24/2015  . Dysphagia   . Essential hypertension 12/14/2019  . Fatty liver   . FATTY LIVER DISEASE 05/11/2008  Qualifier: Diagnosis of  By: Caroline Perches MD, Lowella Bandy   . Gastro-esophageal reflux disease with esophagitis 10/28/2015  . Gastroesophageal reflux disease 08/15/2007   Qualifier: Diagnosis of  By: Roxan Hockey, Norchel    . GERD (gastroesophageal reflux disease)   . High risk medication use 10/28/2015  . Hyperlipemia   . Hyperplastic colon polyp   . Hypertension   . Internal hemorrhoids   . Iron deficiency anemia, unspecified   . Malaise and fatigue 10/28/2015  . Mixed hyperlipidemia 10/28/2015  . Nephrolithiasis 10/28/2015  . Osteoarthritis   .  Palpitations 01/24/2015  . 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 Prediabetes 05/17/2019  . Primary osteoarthritis involving multiple joints 10/28/2015  . 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 Shortness of breath 01/24/2015  . Tachycardia   . Ulcerative colitis   . Ulcerative colitis ([place]) 08/15/2007   Qualifier: Diagnosis of  By: Roxan Hockey, Norchel    . Vitamin B12 deficiency   . Vitamin D deficiency 10/28/2015    Past Surgical History:  Procedure Laterality Date  . BIOPSY  06/19/2019   Procedure: BIOPSY;  Surgeon: Caroline Pole, MD;  Location: [facility];  Service: Endoscopy;;  . COLONOSCOPY    . DILATION AND CURETTAGE OF UTERUS    . ESOPHAGEAL MANOMETRY N/A 06/19/2019   Procedure: ESOPHAGEAL MANOMETRY (EM);  Surgeon: Caroline Pole, MD;  [facility]: [facility];  Service: Endoscopy;  Laterality: N/A;  . ESOPHAGOGASTRODUODENOSCOPY (EGD) WITH PROPOFOL N/A 06/19/2019   Procedure: ESOPHAGOGASTRODUODENOSCOPY (EGD) WITH PROPOFOL;  Surgeon: Caroline Pole, MD;  [facility];  Service: Endoscopy;  Laterality: N/A;  with manometry probe placement  . HIATAL HERNIA REPAIR     January 2021  . TONSILLECTOMY AND ADENOIDECTOMY      Family History  Problem Relation Age of Onset  . Lymphoma Brother   . Diabetes Mother   . Heart disease Father   . Cirrhosis Other        neice with liver transplant  . Colon cancer Neg Hx     Social History   Socioeconomic History  . Marital status: Single    Spouse name: Not on file  . Number of children: 0  . Years of education: Not on file  . Highest education level: Not on file  Occupational History  . Occupation: retired  Tobacco Use  . Smoking status: Never Smoker  . Smokeless tobacco: Never Used  Substance and Sexual Activity  . Alcohol use: Yes    Comment: occasional  . Drug use: No  . Sexual activity: Not on file  Other Topics Concern  . Not on file  Social History  Narrative  . Not on file   Social Determinants of Health   Financial Resource Strain:   . Difficulty of Paying Living Expenses: Not on file  Food Insecurity:   . Worried About Charity fundraiser in the Last Year: Not on file  . Ran Out of Food in the Last Year: Not on file  Transportation Needs:   . Lack of Transportation (Medical): Not on file  . Lack of Transportation (Non-Medical): Not on file  Physical Activity:   . Days of Exercise per Week: Not on file  . Minutes of Exercise per Session: Not on file  Stress:   . Feeling of Stress : Not on file  Social Connections:   . Frequency of Communication with Friends and Family: Not on file  . Frequency of Social Gatherings with Friends and  Family: Not on file  . Attends Religious Services: Not on file  . Active Member of Clubs or Organizations: Not on file  . Attends Archivist Meetings: Not on file  . Marital Status: Not on file  Intimate Partner Violence:   . Fear of Current or Ex-Partner: Not on file  . Emotionally Abused: Not on file  . Physically Abused: Not on file  . Sexually Abused: Not on file      Review of systems: All other review of systems negative except as mentioned in the HPI.   Physical Exam: Vitals:   04/11/20 0920  BP: (!) 150/84  Pulse: 68   Body mass index is 28.92 kg/m. Gen:      No acute distress HEENT:  sclera anicteric Abd:      soft, non-tender; no palpable masses, no distension Ext:    No edema Neuro: alert and oriented x 3 Psych: normal mood and affect  Data Reviewed:  Reviewed labs, radiology imaging, old records and pertinent past GI work up   Assessment and Plan/Recommendations:  79 year old very pleasant female with history of large hiatal hernia s/p Nissen fundoplication and hernia repair, left-sided ulcerative colitis initially diagnosed in 1981 currently in clinical remission  Ulcerative colitis: Due for surveillance colonoscopy She has been in remission for a  long time now, given potential side effects with long-term immunosuppressive therapy, will taper off 6-MP, advised patient take 25 mg daily for 4 to 6 weeks and then discontinue Continue sulfasalazine and folic acid  Continue multivitamins, B12, vitamin D  GERD s/p Nissen fundoplication: Transition to Pepcid twice daily as needed, taper off PPI Continue antireflux measures and lifestyle changes  Will obtain clearance from cardiology to hold Eliquis for 2 days prior to the procedure  Abnormal LFT likely secondary to drug-induced liver injury, trended down based on labs in June 2021.  Will continue to monitor Recheck CBC, CMP, B12, iron panel and folate  The risks and benefits as well as alternatives of endoscopic procedure(s) have been discussed and reviewed. All questions answered. The patient agrees to proceed.  Return in 6 months or sooner if needed  The patient was provided an opportunity to ask questions and all were answered. The patient agreed with the plan and demonstrated an understanding of the instructions.  Damaris Hippo , MD    CC: Caroline Dean., MD

## 2020-04-12 LAB — COMPREHENSIVE METABOLIC PANEL
ALT: 13 U/L (ref 0–35)
AST: 13 U/L (ref 0–37)
Albumin: 4.2 g/dL (ref 3.5–5.2)
Alkaline Phosphatase: 79 U/L (ref 39–117)
BUN: 14 mg/dL (ref 6–23)
CO2: 24 mEq/L (ref 19–32)
Calcium: 8.9 mg/dL (ref 8.4–10.5)
Chloride: 105 mEq/L (ref 96–112)
Creatinine, Ser: 0.79 mg/dL (ref 0.40–1.20)
GFR: 77 mL/min (ref >60.00)
Glucose, Bld: 88 mg/dL (ref 70–99)
Potassium: 4 mEq/L (ref 3.5–5.1)
Sodium: 140 mEq/L (ref 135–145)
Total Bilirubin: 0.8 mg/dL (ref 0.2–1.2)
Total Protein: 6.9 g/dL (ref 6.0–8.3)

## 2020-04-19 ENCOUNTER — Encounter: Payer: Self-pay | Admitting: Gastroenterology

## 2020-05-23 ENCOUNTER — Other Ambulatory Visit: Payer: Self-pay

## 2020-05-23 ENCOUNTER — Ambulatory Visit: Payer: Medicare Other | Admitting: Cardiology

## 2020-05-23 DIAGNOSIS — E785 Hyperlipidemia, unspecified: Secondary | ICD-10-CM | POA: Insufficient documentation

## 2020-05-23 DIAGNOSIS — I10 Essential (primary) hypertension: Secondary | ICD-10-CM | POA: Insufficient documentation

## 2020-05-24 ENCOUNTER — Ambulatory Visit: Payer: Medicare Other | Admitting: Cardiology

## 2020-05-24 ENCOUNTER — Other Ambulatory Visit: Payer: Self-pay

## 2020-05-24 ENCOUNTER — Encounter: Payer: Self-pay | Admitting: Cardiology

## 2020-05-24 ENCOUNTER — Telehealth: Payer: Self-pay

## 2020-05-24 VITALS — BP 155/89 | HR 60 | Ht 66.0 in | Wt 178.6 lb

## 2020-05-24 DIAGNOSIS — I10 Essential (primary) hypertension: Secondary | ICD-10-CM

## 2020-05-24 DIAGNOSIS — I480 Paroxysmal atrial fibrillation: Secondary | ICD-10-CM | POA: Diagnosis not present

## 2020-05-24 DIAGNOSIS — E782 Mixed hyperlipidemia: Secondary | ICD-10-CM | POA: Diagnosis not present

## 2020-05-24 DIAGNOSIS — Z79899 Other long term (current) drug therapy: Secondary | ICD-10-CM | POA: Diagnosis not present

## 2020-05-24 NOTE — Telephone Encounter (Signed)
Samples of Eliquis 5 mg given to patient.  4 boxes Lot Number:[id number] Expiration:10-23

## 2020-05-24 NOTE — Patient Instructions (Signed)
Medication Instructions:  No medication changes. *If you need a refill on your cardiac medications before your next appointment, please call your pharmacy*   Lab Work: None ordered If you have labs (blood work) drawn today and your tests are completely normal, you will receive your results only by: Marland Kitchen MyChart Message (if you have MyChart) OR . A paper copy in the mail If you have any lab test that is abnormal or we need to change your treatment, we will call you to review the results.   Testing/Procedures: None ordered   Follow-Up: At [facility], you and your health needs are our priority.  As part of our continuing mission to provide you with exceptional heart care, we have created designated Provider Care Teams.  These Care Teams include your primary Cardiologist (physician) and Advanced Practice Providers (APPs -  Physician Assistants and Nurse Practitioners) who all work together to provide you with the care you need, when you need it.  We recommend signing up for the patient portal called "MyChart".  Sign up information is provided on this After Visit Summary.  MyChart is used to connect with patients for Virtual Visits (Telemedicine).  Patients are able to view lab/test results, encounter notes, upcoming appointments, etc.  Non-urgent messages can be sent to your provider as well.   To learn more about what you can do with MyChart, go to [email or web address].    Your next appointment:   6 month(s)  The format for your next appointment:   In Person  Provider:   Jyl Heinz, MD   Other Instructions na

## 2020-05-24 NOTE — Progress Notes (Signed)
Cardiology Office Note:    Date:  05/24/2020   ID:  Caroline Dean, DOB Oct 30, 1940, MRN [id number]  PCP:  Caroline Mina., MD  Cardiologist:  Caroline Lindau, MD   Referring MD: Caroline Mina., MD    ASSESSMENT:    1. PAF (paroxysmal atrial fibrillation) (Caroline Dean)   2. Mixed hyperlipidemia   3. Essential hypertension   4. High risk medication use    PLAN:    In order of problems listed above:  1. Primary prevention stressed with the patient. Importance of compliance with diet medication stressed and she vocalized understanding. 2. Essential hypertension: Blood pressure stable and she has an element of whitecoat hypertension. Her blood pressure at home's are fine and she mentioned to me the readings 3. Mixed dyslipidemia: Lipids were reviewed and diet was emphasized. This is managed by her primary care physician. 4. Paroxysmal atrial fibrillation:I discussed with the patient atrial fibrillation, disease process. Management and therapy including rate and rhythm control, anticoagulation benefits and potential risks were discussed extensively with the patient. Patient had multiple questions which were answered to patient's satisfaction. 5. I congratulated her on her excellent exercise regimen. She will continue this. Weight reduction was stressed and she promises to do so. 6. Patient will be seen in follow-up appointment in 6 months or earlier if the patient has any concerns    Medication Adjustments/Labs and Tests Ordered: Current medicines are reviewed at length with the patient today.  Concerns regarding medicines are outlined above.  No orders of the defined types were placed in this encounter.  No orders of the defined types were placed in this encounter.    No chief complaint on file.    History of Present Illness:    Caroline Dean is a 79 y.o. female.  Patient has past medical history of atrial fibrillation, essential hypertension mixed dyslipidemia.  She denies  any problems at this time and takes care of activities of daily living.  No chest pain orthopnea or PND.  She walks half an hour on a regular basis.  At the time of my evaluation, the patient is alert awake oriented and in no distress.  Past Medical History:  Diagnosis Date   Age-related osteoporosis without current pathological fracture 10/28/2015   AGITATION 08/15/2007   Qualifier: Diagnosis of  By: Roxan Hockey, [place]     Anxiety 10/28/2015   Atrial fibrillation ([facility]) 12/13/2019   Cataract    Diverticulosis    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/2007   Qualifier: Diagnosis of  By: Roxan Hockey, Norchel     Dyslipidemia 01/24/2015   Dysphagia    Essential hypertension 12/14/2019   Fatty liver    FATTY LIVER DISEASE 05/11/2008   Qualifier: Diagnosis of  By: [name] MD, Lowella Bandy    Gastro-esophageal reflux disease with esophagitis 10/28/2015   Gastroesophageal reflux disease 08/15/2007   Qualifier: Diagnosis of  By: Roxan Hockey, Norchel     GERD (gastroesophageal reflux disease)    High risk medication use 10/28/2015   Hyperlipemia    Hyperplastic colon polyp    Hypertension    Internal hemorrhoids    Iron deficiency anemia, unspecified    Malaise and fatigue 10/28/2015   Mixed hyperlipidemia 10/28/2015   Nephrolithiasis 10/28/2015   Osteoarthritis    PAF (paroxysmal atrial fibrillation) ([place]) 01/18/2020   Palpitations 01/24/2015   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   Prediabetes 05/17/2019   Primary osteoarthritis involving multiple joints 10/28/2015   S/P Nissen fundoplication (without gastrostomy  tube) procedure 07/14/2019   Shortness of breath 01/24/2015   Tachycardia    Ulcerative colitis    Ulcerative colitis ([place]) 08/15/2007   Qualifier: Diagnosis of  By: Roxan Hockey, Norchel     Vitamin B12 deficiency    Vitamin D deficiency 10/28/2015    Past Surgical History:  Procedure Laterality Date   BIOPSY  06/19/2019   Procedure: BIOPSY;   Surgeon: Mauri Pole, MD;  [facility];  Service: Endoscopy;;   COLONOSCOPY     DILATION AND CURETTAGE OF UTERUS     ESOPHAGEAL MANOMETRY N/A 06/19/2019   Procedure: ESOPHAGEAL MANOMETRY (EM);  Surgeon: Mauri Pole, MD;  [facility];  Service: Endoscopy;  Laterality: N/A;   ESOPHAGOGASTRODUODENOSCOPY (EGD) WITH PROPOFOL N/A 06/19/2019   Procedure: ESOPHAGOGASTRODUODENOSCOPY (EGD) WITH PROPOFOL;  Surgeon: Mauri Pole, MD;  [facility];  Service: Endoscopy;  Laterality: N/A;  with manometry probe placement   HIATAL HERNIA REPAIR     January 2021   TONSILLECTOMY AND ADENOIDECTOMY      Current Medications: Current Meds  Medication Sig   ALPRAZolam (XANAX) 0.25 MG tablet Take 0.25 mg by mouth daily as needed for anxiety.    amLODipine (NORVASC) 2.5 MG tablet Take 2.5 mg by mouth daily.    Cholecalciferol (VITAMIN D-1000 MAX ST) 1000 units tablet Take 3,000 Units by mouth daily.    Cyanocobalamin 1000 MCG/ML KIT Inject as directed every 30 (thirty) days.   diltiazem (TIAZAC) 120 MG 24 hr capsule Take 1 capsule (120 mg total) by mouth daily.   ELIQUIS 5 MG TABS tablet Take 1 tablet (5 mg total) by mouth 2 (two) times daily.   famotidine (PEPCID) 40 MG tablet Take 1 tablet (40 mg total) by mouth daily.   ferrous sulfate 325 (65 FE) MG tablet Take 325 mg by mouth 3 (three) times a week.    folic acid (FOLVITE) 1 MG tablet Take 1 tablet (1 mg total) by mouth daily.   mercaptopurine (PURINETHOL) 50 MG tablet Take 1 tablet (50 mg total) by mouth daily. Give on an empty stomach 1 hour before or 2 hours after meals. Caution: Chemotherapy.   metoprolol tartrate (LOPRESSOR) 50 MG tablet Take 50 mg by mouth 2 (two) times daily.    simvastatin (ZOCOR) 20 MG tablet Take 20 mg by mouth daily.    sodium chloride (OCEAN) 0.65 % SOLN nasal spray Place 1 spray into both nostrils as needed for congestion.   sulfaSALAzine (AZULFIDINE) 500  MG tablet Take 2 tablets (1,000 mg total) by mouth 2 (two) times daily.   Current Facility-Administered Medications for the 05/24/20 encounter (Office Visit) with Caroline Dean, Caroline Cliche, MD  Medication   0.9 %  sodium chloride infusion     Allergies:   Codeine   Social History   Socioeconomic History   Marital status: Single    Spouse name: Not on file   Number of children: 0   Years of education: Not on file   Highest education level: Not on file  Occupational History   Occupation: retired  Tobacco Use   Smoking status: Never Smoker   Smokeless tobacco: Never Used  Substance and Sexual Activity   Alcohol use: Yes    Comment: occasional   Drug use: No   Sexual activity: Not on file  Other Topics Concern   Not on file  Social History Narrative   Not on file   Social Determinants of Health   Financial Resource Strain:  Difficulty of Paying Living Expenses: Not on file  Food Insecurity:    Worried About [place] in the Last Year: Not on file   Ran Out of Food in the Last Year: Not on file  Transportation Needs:    Lack of Transportation (Medical): Not on file   Lack of Transportation (Non-Medical): Not on file  Physical Activity:    Days of Exercise per Week: Not on file   Minutes of Exercise per Session: Not on file  Stress:    Feeling of Stress : Not on file  Social Connections:    Frequency of Communication with Friends and Family: Not on file   Frequency of Social Gatherings with Friends and Family: Not on file   Attends Religious Services: Not on file   Active Member of Clubs or Organizations: Not on file   Attends Archivist Meetings: Not on file   Marital Status: Not on file     Family History: The patient's family history includes Cirrhosis in an other family member; Diabetes in her mother; Heart disease in her father; Lymphoma in her brother. There is no history of Colon cancer.  ROS:   Please see the  history of present illness.    All other systems reviewed and are negative.  EKGs/Labs/Other Studies Reviewed:    The following studies were reviewed today: I discussed my findings with the patient at length.   Recent Labs: 04/11/2020: ALT 13; BUN 14; Creatinine, Ser 0.79; Hemoglobin 12.1; Platelets 225.0; Potassium 4.0; Sodium 140  Recent Lipid Panel No results found for: CHOL, TRIG, HDL, CHOLHDL, VLDL, LDLCALC, LDLDIRECT  Physical Exam:    VS:  BP (!) 155/89    Pulse 60    Ht _0  (1.676 m)    Wt 178 lb 9.6 oz (81 kg)    SpO2 94%    BMI 28.83 kg/m     Wt Readings from Last 3 Encounters:  05/24/20 178 lb 9.6 oz (81 kg)  04/11/20 176 lb 8 oz (80.1 kg)  01/18/20 176 lb 3.2 oz (79.9 kg)     GEN: Patient is in no acute distress HEENT: Normal NECK: No JVD; No carotid bruits LYMPHATICS: No lymphadenopathy CARDIAC: Hear sounds regular, 2/6 systolic murmur at the apex. RESPIRATORY:  Clear to auscultation without rales, wheezing or rhonchi  ABDOMEN: Soft, non-tender, non-distended MUSCULOSKELETAL:  No edema; No deformity  SKIN: Warm and dry NEUROLOGIC:  Alert and oriented x 3 PSYCHIATRIC:  Normal affect   Signed, Caroline Lindau, MD  05/24/2020 9:55 AM    [place]

## 2020-07-15 ENCOUNTER — Telehealth: Payer: Self-pay | Admitting: Gastroenterology

## 2020-07-15 MED ORDER — FOLIC ACID 1 MG PO TABS
1.0000 mg | ORAL_TABLET | Freq: Every day | ORAL | 6 refills | Status: DC
Start: 2020-07-15 — End: 2020-07-26

## 2020-07-15 NOTE — Telephone Encounter (Signed)
[facility] called requesting refill for folic acid. Please send it to [facility] on [address] In [place].

## 2020-07-15 NOTE — Telephone Encounter (Signed)
Prescription faxed to [facility] today

## 2020-07-26 ENCOUNTER — Other Ambulatory Visit: Payer: Self-pay | Admitting: Gastroenterology

## 2020-08-28 DIAGNOSIS — K8020 Calculus of gallbladder without cholecystitis without obstruction: Secondary | ICD-10-CM

## 2020-08-28 HISTORY — DX: Calculus of gallbladder without cholecystitis without obstruction: K80.20

## 2020-11-01 ENCOUNTER — Other Ambulatory Visit: Payer: Self-pay | Admitting: Cardiology

## 2020-11-21 ENCOUNTER — Ambulatory Visit: Payer: Medicare Other | Admitting: Gastroenterology

## 2020-11-21 ENCOUNTER — Telehealth: Payer: Self-pay | Admitting: *Deleted

## 2020-11-21 ENCOUNTER — Encounter: Payer: Self-pay | Admitting: Gastroenterology

## 2020-11-21 VITALS — BP 140/98 | HR 73 | Ht 65.5 in | Wt 183.1 lb

## 2020-11-21 DIAGNOSIS — K5180 Other ulcerative colitis without complications: Secondary | ICD-10-CM | POA: Diagnosis not present

## 2020-11-21 MED ORDER — SUPREP BOWEL PREP KIT 17.5-3.13-1.6 GM/177ML PO SOLN: ORAL | 0 refills | Status: DC

## 2020-11-21 NOTE — Patient Instructions (Signed)
You have been scheduled for a colonoscopy. Please follow written instructions given to you at your visit today.  Please pick up your prep supplies at the pharmacy within the next 1-3 days. If you use inhalers (even only as needed), please bring them with you on the day of your procedure.  Due to recent changes in healthcare laws, you may see the results of your imaging and laboratory studies on MyChart before your provider has had a chance to review them.  We understand that in some cases there may be results that are confusing or concerning to you. Not all laboratory results come back in the same time frame and the provider may be waiting for multiple results in order to interpret others.  Please give [place] 48 hours in order for your provider to thoroughly review all the results before contacting the office for clarification of your results.   If you are age 30 or older, your body mass index should be between 23-30. Your Body mass index is 30.01 kg/m. If this is out of the aforementioned range listed, please consider follow up with your Primary Care Provider.  If you are age 54 or younger, your body mass index should be between 19-25. Your Body mass index is 30.01 kg/m. If this is out of the aformentioned range listed, please consider follow up with your Primary Care Provider.   __________________________________________________________  The [place] [facility] providers would like to encourage you to use [facility] to communicate with providers for non-urgent requests or questions.  Due to long hold times on the telephone, sending your provider a message by [facility] may be a faster and more efficient way to get a response.  Please allow 48 business hours for a response.  Please remember that this is for non-urgent requests.

## 2020-11-21 NOTE — Progress Notes (Signed)
Caroline Dean    [id number]    11/22/1940  Primary Care Physician:Grisso, Clarita Crane., MD  Referring Physician: [name], MD [address] [place],  [place] [address]   Chief complaint:    HPI:  80 year old very pleasant female with history of large hiatal hernia s/p Nissen fundoplication for follow-up visit for ulcerative proctosigmoiditis   Colonoscopy was canceled because she went into new onset A. fib, currently is rate controlled and was started on chronic anticoagulation.  Overall she is feeling well   She is taking sulfasalazine with no recent flares.  She was tapered off 6-MP. She has intermittent diarrhea, denies any blood in stool or rectal bleeding. No nausea, vomiting, dysphagia, odynophagia or abdominal pain  She is due for surveillance colonoscopy   Last colonoscopy by Dr. [name] approximately 3 years ago, is not available for review during this visit. She has had multiple surveillance colonoscopies by Dr. [name], most recent in November 2013 showed mild colitis 40 to 70 cm and moderately severe colitis in rectum and sigmoid colon   Outpatient Encounter Medications as of 11/21/2020  Medication Sig   ALPRAZolam (XANAX) 0.25 MG tablet Take 0.25 mg by mouth daily as needed for anxiety.    Cholecalciferol 25 MCG (1000 UT) tablet Take 3,000 Units by mouth daily.    Cyanocobalamin 1000 MCG/ML KIT Inject 1,000 mcg as directed every 30 (thirty) days.   ferrous sulfate 325 (65 FE) MG tablet Take 325 mg by mouth 3 (three) times a week.    metoprolol tartrate (LOPRESSOR) 50 MG tablet Take 25 mg by mouth 2 (two) times daily.   omeprazole (PRILOSEC) 20 MG capsule Take 20 mg by mouth daily.   simvastatin (ZOCOR) 20 MG tablet Take 20 mg by mouth daily.    sodium chloride (OCEAN) 0.65 % SOLN nasal spray Place 1 spray into both nostrils as needed for congestion.   sulfaSALAzine (AZULFIDINE) 500 MG tablet Take 2 tablets (1,000 mg total) by mouth 2 (two) times  daily.   [DISCONTINUED] amLODipine (NORVASC) 2.5 MG tablet Take 2.5 mg by mouth daily.    [DISCONTINUED] diltiazem (CARDIZEM CD) 120 MG 24 hr capsule TAKE 1 CAPSULE BY MOUTH ONCE DAILY   [DISCONTINUED] ELIQUIS 5 MG TABS tablet Take 1 tablet (5 mg total) by mouth 2 (two) times daily.   [DISCONTINUED] folic acid (FOLVITE) 1 MG tablet TAKE 1 TABLET BY MOUTH ONCE DAILY   [DISCONTINUED] Na Sulfate-K Sulfate-Mg Sulf (SUPREP BOWEL PREP KIT) 17.5-3.13-1.6 GM/177ML SOLN 1 prep kit   [DISCONTINUED] diltiazem (TIAZAC) 120 MG 24 hr capsule Take 1 capsule (120 mg total) by mouth daily.   [DISCONTINUED] famotidine (PEPCID) 40 MG tablet Take 1 tablet (40 mg total) by mouth daily.   [DISCONTINUED] mercaptopurine (PURINETHOL) 50 MG tablet Take 1 tablet (50 mg total) by mouth daily. Give on an empty stomach 1 hour before or 2 hours after meals. Caution: Chemotherapy.   [DISCONTINUED] 0.9 %  sodium chloride infusion    No facility-administered encounter medications on file as of 11/21/2020.    Allergies as of 11/21/2020 - Review Complete 11/21/2020  Allergen Reaction Noted   Codeine Nausea And Vomiting 05/10/2008    Past Medical History:  Diagnosis Date   Age-related osteoporosis without current pathological fracture 10/28/2015   AGITATION 08/15/2007   Qualifier: Diagnosis of  By: Roxan Hockey, [place]     Anxiety 10/28/2015   Atrial fibrillation ([place]) 12/13/2019   Cataract  Diverticulosis    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/2007   Qualifier: Diagnosis of  By: Roxan Hockey, Norchel     Dyslipidemia 01/24/2015   Dysphagia    Essential hypertension 12/14/2019   Fatty liver    FATTY LIVER DISEASE 05/11/2008   Qualifier: Diagnosis of  By: [name] MD, Lowella Bandy    Gastro-esophageal reflux disease with esophagitis 10/28/2015   Gastroesophageal reflux disease 08/15/2007   Qualifier: Diagnosis of  By: Roxan Hockey, Norchel     GERD (gastroesophageal reflux disease)    High risk medication use 10/28/2015    Hyperlipemia    Hyperplastic colon polyp    Hypertension    Internal hemorrhoids    Iron deficiency anemia, unspecified    Malaise and fatigue 10/28/2015   Mixed hyperlipidemia 10/28/2015   Nephrolithiasis 10/28/2015   Osteoarthritis    PAF (paroxysmal atrial fibrillation) ([place]) 01/18/2020   Palpitations 01/24/2015   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   Prediabetes 05/17/2019   Primary osteoarthritis involving multiple joints 10/28/2015   S/P Nissen fundoplication (without gastrostomy tube) procedure 07/14/2019   Shortness of breath 01/24/2015   Tachycardia    Ulcerative colitis    Ulcerative colitis ([place]) 08/15/2007   Qualifier: Diagnosis of  By: Roxan Hockey, Norchel     Vitamin B12 deficiency    Vitamin D deficiency 10/28/2015    Past Surgical History:  Procedure Laterality Date   BIOPSY  06/19/2019   Procedure: BIOPSY;  Surgeon: Mauri Pole, MD;  Location: [facility];  Service: Endoscopy;;   COLONOSCOPY     DILATION AND CURETTAGE OF UTERUS     ESOPHAGEAL MANOMETRY N/A 06/19/2019   Procedure: ESOPHAGEAL MANOMETRY (EM);  Surgeon: Mauri Pole, MD;  Location: [facility];  Service: Endoscopy;  Laterality: N/A;   ESOPHAGOGASTRODUODENOSCOPY (EGD) WITH PROPOFOL N/A 06/19/2019   Procedure: ESOPHAGOGASTRODUODENOSCOPY (EGD) WITH PROPOFOL;  Surgeon: Mauri Pole, MD;  Location: [facility];  Service: Endoscopy;  Laterality: N/A;  with manometry probe placement   HIATAL HERNIA REPAIR     January 2021   TONSILLECTOMY AND ADENOIDECTOMY      Family History  Problem Relation Age of Onset   Lymphoma Brother    Diabetes Mother    Heart disease Father    Cirrhosis Other        neice with liver transplant   Colon cancer Neg Hx     Social History   Socioeconomic History   Marital status: Single    Spouse name: Not on file   Number of children: 0   Years of education: Not on file   Highest education level: Not on file  Occupational History   Occupation: retired   Tobacco Use   Smoking status: Never Smoker   Smokeless tobacco: Never Used  Substance and Sexual Activity   Alcohol use: Yes    Comment: occasional   Drug use: No   Sexual activity: Not on file  Other Topics Concern   Not on file  Social History Narrative   Not on file   Social Determinants of Health   Financial Resource Strain: Not on file  Food Insecurity: Not on file  Transportation Needs: Not on file  Physical Activity: Not on file  Stress: Not on file  Social Connections: Not on file  Intimate Partner Violence: Not on file      Review of systems: All other review of systems negative except as mentioned in the HPI.   Physical Exam: Vitals:   11/21/20 1104  BP: Marland Kitchen)  140/98  Pulse: 73   Body mass index is 30.01 kg/m. Gen:      No acute distress HEENT:  sclera anicteric Abd:      soft, non-tender; no palpable masses, no distension Ext:    No edema Neuro: alert and oriented x 3 Psych: normal mood and affect  Data Reviewed:  Reviewed labs, radiology imaging, old records and pertinent past GI work up   Assessment and Plan/Recommendations:  80 year old very pleasant female with history of large hiatal hernia s/p Nissen fundoplication and hernia repair, left-sided ulcerative colitis initially diagnosed in 1981 currently in clinical remission   Ulcerative colitis: Due for surveillance colonoscopy She has been in remission for a long time now, given potential side effects with long-term immunosuppressive therapy, she was tapered off 6-MP, currently doing well with no recent flares. Continue sulfasalazine and folic acid   Continue multivitamins, B12, vitamin D   GERD s/p Nissen fundoplication: Transition to Pepcid twice daily as needed, taper off PPI Continue antireflux measures and lifestyle changes   Will obtain clearance from cardiology to hold Eliquis for 2 days prior to the procedure     The risks and benefits as well as alternatives of endoscopic  procedure(s) have been discussed and reviewed. All questions answered. The patient agrees to proceed.   Return in 6 months or sooner if needed  The patient was provided an opportunity to ask questions and all were answered. The patient agreed with the plan and demonstrated an understanding of the instructions.  Damaris Hippo , MD    CC: [name], MD

## 2020-11-21 NOTE — Telephone Encounter (Signed)
[facility] Pre-operative Risk Assessment     Request for surgical clearance:     Endoscopy Procedure  What type of surgery is being performed?     Colonoscopy   When is this surgery scheduled?     01/23/2021  What type of clearance is required ?   Pharmacy  Are there any medications that need to be held prior to surgery and how long? Eliquis 2 days   Practice name and name of physician performing surgery?      [facility]   Dr Silverio Decamp   What is your office phone and fax number?      Phone- [phone]  [phone]  Anesthesia type (None, local, MAC, general) ?       MAC

## 2020-11-21 NOTE — Telephone Encounter (Signed)
    Caroline Dean DOB:  May 11, 1941  MRN:  [id number]   Primary Cardiologist: None  Chart reviewed as part of pre-operative protocol coverage. Pharmacy clearance only requested.Caroline Dean may hold Eliquis 2 days prior to planned procedure.  I will route this recommendation to the requesting party via Epic fax function and remove from pre-op pool.  Please call with questions.  Loel Dubonnet, NP 11/21/2020, 3:45 PM

## 2020-11-21 NOTE — Telephone Encounter (Signed)
Patient with diagnosis of afib on Eliquis for anticoagulation.    Procedure: endoscopy Date of procedure: 01/23/21  CHA2DS2-VASc Score = 4  This indicates a 4.8% annual risk of stroke. The patient's score is based upon: CHF History: No HTN History: Yes Diabetes History: No Stroke History: No Vascular Disease History: No Age Score: 2 Gender Score: 1   CrCl 80 Platelet count 225K  Per office protocol, patient can hold Eliquis for 2 days prior to procedure as requested.

## 2020-11-21 NOTE — Telephone Encounter (Signed)
Patient ok'd to hold Eliquis 2 days before procedure she is aware

## 2020-11-22 ENCOUNTER — Other Ambulatory Visit: Payer: Self-pay

## 2020-11-22 MED ORDER — ELIQUIS 5 MG PO TABS: 5.0000 mg | ORAL_TABLET | Freq: Two times a day (BID) | ORAL | 1 refills | Status: DC

## 2020-11-22 NOTE — Telephone Encounter (Signed)
57f 83.1kg, scr 0.76 08/28/20, lovw/revankar 05/24/20

## 2020-11-27 ENCOUNTER — Other Ambulatory Visit: Payer: Self-pay

## 2020-11-27 ENCOUNTER — Encounter: Payer: Medicare Other | Admitting: Gastroenterology

## 2020-11-27 ENCOUNTER — Other Ambulatory Visit: Payer: Self-pay | Admitting: Gastroenterology

## 2020-11-28 ENCOUNTER — Encounter: Payer: Self-pay | Admitting: Gastroenterology

## 2020-12-02 ENCOUNTER — Encounter: Payer: Self-pay | Admitting: Cardiology

## 2020-12-02 ENCOUNTER — Ambulatory Visit: Payer: Medicare Other | Admitting: Cardiology

## 2020-12-02 ENCOUNTER — Other Ambulatory Visit: Payer: Self-pay

## 2020-12-02 VITALS — BP 146/88 | HR 66 | Ht 67.0 in | Wt 182.8 lb

## 2020-12-02 DIAGNOSIS — I480 Paroxysmal atrial fibrillation: Secondary | ICD-10-CM

## 2020-12-02 DIAGNOSIS — I10 Essential (primary) hypertension: Secondary | ICD-10-CM | POA: Diagnosis not present

## 2020-12-02 NOTE — Progress Notes (Signed)
Cardiology Office Note:    Date:  12/02/2020   ID:  Caroline Dean, DOB 1941-03-15, MRN [id number]  PCP:  [name], MD  Cardiologist:  Jenean Lindau, MD   Referring MD: [name], MD    ASSESSMENT:    1. PAF (paroxysmal atrial fibrillation) ([place])   2. Essential hypertension    PLAN:    In order of problems listed above:  Primary prevention stressed with the patient.  Importance of compliance with diet medication stressed and she vocalized understanding. Paroxysmal atrial fibrillation:I discussed with the patient atrial fibrillation, disease process. Management and therapy including rate and rhythm control, anticoagulation benefits and potential risks were discussed extensively with the patient. Patient had multiple questions which were answered to patient's satisfaction. Essential hypertension: Blood pressure stable and lifestyle modification was urged.  She has good blood pressures at home.  She has an element of whitecoat hypertension. Mixed dyslipidemia: Diet was emphasized.  Lab work was reviewed. Patient will be seen in follow-up appointment in 6 months or earlier if the patient has any concerns    Medication Adjustments/Labs and Tests Ordered: Current medicines are reviewed at length with the patient today.  Concerns regarding medicines are outlined above.  No orders of the defined types were placed in this encounter.  No orders of the defined types were placed in this encounter.    No chief complaint on file.    History of Present Illness:    Caroline Dean is a 80 y.o. female.  Patient has past medical history of paroxysmal atrial fibrillation, essential hypertension and dyslipidemia.  She denies any problems at this time and takes care of activities of daily living.  No chest pain orthopnea or PND.  At the time of my evaluation, the patient is alert awake oriented and in no distress.  She walks on a regular basis and her effort tolerance is  excellent.  Past Medical History:  Diagnosis Date   Age-related osteoporosis without current pathological fracture 10/28/2015   AGITATION 08/15/2007   Qualifier: Diagnosis of  By: Roxan Hockey, Nor[place]     Anxiety 10/28/2015   Atrial fibrillation ([place]) 12/13/2019   Calculus of gallbladder without cholecystitis without obstruction 08/28/2020   Cataract    Diverticulosis    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/2007   Qualifier: Diagnosis of  By: Roxan Hockey, Norchel     Dyslipidemia 01/24/2015   Dysphagia    Essential hypertension 12/14/2019   Fatty liver    FATTY LIVER DISEASE 05/11/2008   Qualifier: Diagnosis of  By: [name] MD, Lowella Bandy    Gastro-esophageal reflux disease with esophagitis 10/28/2015   Gastroesophageal reflux disease 08/15/2007   Qualifier: Diagnosis of  By: Roxan Hockey, Norchel     GERD (gastroesophageal reflux disease)    High risk medication use 10/28/2015   Hyperlipemia    Hyperplastic colon polyp    Hypertension    Internal hemorrhoids    Iron deficiency anemia, unspecified    Malaise and fatigue 10/28/2015   Mixed hyperlipidemia 10/28/2015   Nephrolithiasis 10/28/2015   Osteoarthritis    PAF (paroxysmal atrial fibrillation) ([place]) 01/18/2020   Palpitations 01/24/2015   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   Prediabetes 05/17/2019   Primary osteoarthritis involving multiple joints 10/28/2015   S/P Nissen fundoplication (without gastrostomy tube) procedure 07/14/2019   Shortness of breath 01/24/2015   Tachycardia    Ulcerative colitis ([place]) 08/15/2007   Qualifier: Diagnosis of  By: Roxan Hockey, Norchel  Vitamin B12 deficiency    Vitamin D deficiency 10/28/2015    Past Surgical History:  Procedure Laterality Date   BIOPSY  06/19/2019   Procedure: BIOPSY;  Surgeon: Mauri Pole, MD;  Location: [facility];  Service: Endoscopy;;   COLONOSCOPY     DILATION AND CURETTAGE OF UTERUS     ESOPHAGEAL MANOMETRY N/A 06/19/2019   Procedure: ESOPHAGEAL MANOMETRY  (EM);  Surgeon: Mauri Pole, MD;  Location: [facility];  Service: Endoscopy;  Laterality: N/A;   ESOPHAGOGASTRODUODENOSCOPY (EGD) WITH PROPOFOL N/A 06/19/2019   Procedure: ESOPHAGOGASTRODUODENOSCOPY (EGD) WITH PROPOFOL;  Surgeon: Mauri Pole, MD;  Location: [facility];  Service: Endoscopy;  Laterality: N/A;  with manometry probe placement   HIATAL HERNIA REPAIR     January 2021   TONSILLECTOMY AND ADENOIDECTOMY      Current Medications: Current Meds  Medication Sig   ALPRAZolam (XANAX) 0.25 MG tablet Take 0.25 mg by mouth daily as needed for anxiety.    amLODipine (NORVASC) 5 MG tablet Take 1 tablet by mouth daily.   Cholecalciferol 25 MCG (1000 UT) tablet Take 3,000 Units by mouth daily.    Cyanocobalamin 1000 MCG/ML KIT Inject 1,000 mcg as directed every 30 (thirty) days.   diltiazem (CARDIZEM CD) 120 MG 24 hr capsule Take 120 mg by mouth daily.   ELIQUIS 5 MG TABS tablet Take 1 tablet (5 mg total) by mouth 2 (two) times daily.   ferrous sulfate 325 (65 FE) MG tablet Take 325 mg by mouth 3 (three) times a week.    folic acid (FOLVITE) 1 MG tablet Take 1 mg by mouth daily.   metoprolol tartrate (LOPRESSOR) 50 MG tablet Take 25 mg by mouth 2 (two) times daily.   omeprazole (PRILOSEC) 20 MG capsule Take 20 mg by mouth daily.   simvastatin (ZOCOR) 20 MG tablet Take 20 mg by mouth daily.    sodium chloride (OCEAN) 0.65 % SOLN nasal spray Place 1 spray into both nostrils as needed for congestion.   sulfaSALAzine (AZULFIDINE) 500 MG tablet Take 2 tablets (1,000 mg total) by mouth 2 (two) times daily.   [DISCONTINUED] famotidine (PEPCID) 40 MG tablet Take 40 mg by mouth daily.     Allergies:   Codeine   Social History   Socioeconomic History   Marital status: Single    Spouse name: Not on file   Number of children: 0   Years of education: Not on file   Highest education level: Not on file  Occupational History   Occupation: retired  Tobacco Use   Smoking status:  Never   Smokeless tobacco: Never  Substance and Sexual Activity   Alcohol use: Yes    Comment: occasional   Drug use: No   Sexual activity: Not on file  Other Topics Concern   Not on file  Social History Narrative   Not on file   Social Determinants of Health   Financial Resource Strain: Not on file  Food Insecurity: Not on file  Transportation Needs: Not on file  Physical Activity: Not on file  Stress: Not on file  Social Connections: Not on file     Family History: The patient's family history includes Cirrhosis in an other family member; Diabetes in her mother; Heart disease in her father; Lymphoma in her brother. There is no history of Colon cancer.  ROS:   Please see the history of present illness.    All other systems reviewed and are negative.  EKGs/Labs/Other Studies Reviewed:  The following studies were reviewed today: I discussed my findings with the patient in extensive length including recent lab work   Recent Labs: 04/11/2020: ALT 13; BUN 14; Creatinine, Ser 0.79; Hemoglobin 12.1; Platelets 225.0; Potassium 4.0; Sodium 140  Recent Lipid Panel No results found for: CHOL, TRIG, HDL, CHOLHDL, VLDL, LDLCALC, LDLDIRECT  Physical Exam:    VS:  BP (!) 146/88   Pulse 66   Ht 5' 7"  (1.702 m)   Wt 182 lb 12.8 oz (82.9 kg)   SpO2 97%   BMI 28.63 kg/m     Wt Readings from Last 3 Encounters:  12/02/20 182 lb 12.8 oz (82.9 kg)  11/21/20 183 lb 2 oz (83.1 kg)  05/24/20 178 lb 9.6 oz (81 kg)     GEN: Patient is in no acute distress HEENT: Normal NECK: No JVD; No carotid bruits LYMPHATICS: No lymphadenopathy CARDIAC: Hear sounds regular, 2/6 systolic murmur at the apex. RESPIRATORY:  Clear to auscultation without rales, wheezing or rhonchi  ABDOMEN: Soft, non-tender, non-distended MUSCULOSKELETAL:  No edema; No deformity  SKIN: Warm and dry NEUROLOGIC:  Alert and oriented x 3 PSYCHIATRIC:  Normal affect   Signed, Jenean Lindau, MD  12/02/2020 8:33  AM    [facility]

## 2020-12-02 NOTE — Patient Instructions (Signed)

## 2021-01-23 ENCOUNTER — Encounter: Payer: Self-pay | Admitting: Gastroenterology

## 2021-01-23 ENCOUNTER — Other Ambulatory Visit: Payer: Self-pay

## 2021-01-23 ENCOUNTER — Ambulatory Visit (AMBULATORY_SURGERY_CENTER): Payer: Medicare Other | Admitting: Gastroenterology

## 2021-01-23 VITALS — BP 131/83 | HR 81 | Temp 97.5°F | Resp 13 | Ht 65.0 in | Wt 183.0 lb

## 2021-01-23 DIAGNOSIS — K5100 Ulcerative (chronic) pancolitis without complications: Secondary | ICD-10-CM

## 2021-01-23 DIAGNOSIS — K5180 Other ulcerative colitis without complications: Secondary | ICD-10-CM | POA: Diagnosis not present

## 2021-01-23 DIAGNOSIS — K5730 Diverticulosis of large intestine without perforation or abscess without bleeding: Secondary | ICD-10-CM | POA: Diagnosis not present

## 2021-01-23 DIAGNOSIS — D120 Benign neoplasm of cecum: Secondary | ICD-10-CM

## 2021-01-23 DIAGNOSIS — K635 Polyp of colon: Secondary | ICD-10-CM

## 2021-01-23 HISTORY — PX: COLONOSCOPY: SHX174

## 2021-01-23 MED ORDER — SODIUM CHLORIDE 0.9 % IV SOLN: 500.0000 mL | INTRAVENOUS | Status: AC

## 2021-01-23 NOTE — Op Note (Signed)
Caroline Dean Patient Name: Caroline Dean Procedure Date: 01/23/2021 9:53 AM MRN: [id number] Endoscopist: Mauri Pole , MD Age: 80 Referring MD:  Date of Birth: August 03, 1940 Gender: Female Account #: [id number] Procedure:                Colonoscopy Indications:              High risk colon cancer surveillance: Ulcerative                            pancolitis of 8 (or more) years duration Medicines:                Monitored Anesthesia Care Procedure:                Pre-Anesthesia Assessment:                           - Prior to the procedure, a History and Physical                            was performed, and patient medications and                            allergies were reviewed. The patient's tolerance of                            previous anesthesia was also reviewed. The risks                            and benefits of the procedure and the sedation                            options and risks were discussed with the patient.                            All questions were answered, and informed consent                            was obtained. Prior Anticoagulants: The patient                            last took Eliquis (apixaban) 2 days prior to the                            procedure. ASA Grade Assessment: II - A patient                            with mild systemic disease. After reviewing the                            risks and benefits, the patient was deemed in                            satisfactory condition to undergo the procedure.  After obtaining informed consent, the colonoscope                            was passed under direct vision. Throughout the                            procedure, the patient's blood pressure, pulse, and                            oxygen saturations were monitored continuously. The                            PCF-HQ190L Colonoscope was introduced through the                            anus and  advanced to the the cecum, identified by                            appendiceal orifice and ileocecal valve. The                            colonoscopy was performed without difficulty. The                            patient tolerated the procedure well. The quality                            of the bowel preparation was adequate. The                            ileocecal valve, appendiceal orifice, and rectum                            were photographed. Scope In: 10:09:04 AM Scope Out: 10:43:07 AM Scope Withdrawal Time: 0 hours 28 minutes 31 seconds  Total Procedure Duration: 0 hours 34 minutes 3 seconds  Findings:                 The perianal and digital rectal examinations were                            normal.                           Three sessile polyps were found in the cecum. The                            polyps were 8 to 14 mm in size. These polyps were                            removed with a cold snare. Resection and retrieval                            were complete.  The mucosa vascular pattern in the entire colon was                            diffusely decreased.                           Scattered pseudopolyps were found in the entire                            colon. Biopsies were taken with a cold forceps for                            histology.                           Multiple small and large-mouthed diverticula were                            found in the sigmoid colon.                           Non-bleeding external and internal hemorrhoids were                            found during retroflexion. The hemorrhoids were                            small. Complications:            No immediate complications. Estimated Blood Loss:     Estimated blood loss was minimal. Impression:               - Three 8 to 14 mm polyps in the cecum, removed                            with a cold snare. Resected and retrieved.                           -  Decreased mucosa vascular pattern in the entire                            examined colon.                           - Pseudopolyps in the entire examined colon.                            Biopsied.                           - Diverticulosis in the sigmoid colon.                           - Non-bleeding external and internal hemorrhoids. Recommendation:           - Patient has a contact number available for  emergencies. The signs and symptoms of potential                            delayed complications were discussed with the                            patient. Return to normal activities tomorrow.                            Written discharge instructions were provided to the                            patient.                           - Resume previous diet.                           - Continue present medications.                           - Resume Eliquis (apixaban) at prior dose in 3                            days. Refer to managing physician for further                            adjustment of therapy.                           - No aspirin, ibuprofen, naproxen, or other                            non-steroidal anti-inflammatory drugs for 2 weeks.                           - Await pathology results.                           - Repeat colonoscopy date to be determined after                            pending pathology results are reviewed for                            surveillance based on pathology results. Mauri Pole, MD 01/23/2021 10:51:15 AM This report has been signed electronically.

## 2021-01-23 NOTE — Progress Notes (Signed)
V/S CW I have reviewed the patient's medical history in detail and updated the computerized patient record.

## 2021-01-23 NOTE — Patient Instructions (Addendum)
YOU HAD AN ENDOSCOPIC PROCEDURE TODAY AT [facility]:   Refer to the procedure report that was given to you for any specific questions about what was found during the examination.  If the procedure report does not answer your questions, please call your gastroenterologist to clarify.  If you requested that your care partner not be given the details of your procedure findings, then the procedure report has been included in a sealed envelope for you to review at your convenience later.  YOU SHOULD EXPECT: Some feelings of bloating in the abdomen. Passage of more gas than usual.  Walking can help get rid of the air that was put into your GI tract during the procedure and reduce the bloating. If you had a lower endoscopy (such as a colonoscopy or flexible sigmoidoscopy) you may notice spotting of blood in your stool or on the toilet paper. If you underwent a bowel prep for your procedure, you may not have a normal bowel movement for a few days.  Please Note:  You might notice some irritation and congestion in your nose or some drainage.  This is from the oxygen used during your procedure.  There is no need for concern and it should clear up in a day or so.  SYMPTOMS TO REPORT IMMEDIATELY:  Following lower endoscopy (colonoscopy or flexible sigmoidoscopy):  Excessive amounts of blood in the stool  Significant tenderness or worsening of abdominal pains  Swelling of the abdomen that is new, acute  Fever of 100F or higher   For urgent or emergent issues, a gastroenterologist can be reached at any hour by calling [phone]. Do not use MyChart messaging for urgent concerns.    DIET:  We do recommend a small meal at first, but then you may proceed to your regular diet.  Drink plenty of fluids but you should avoid alcoholic beverages for 24 hours.  MEDICATIONS: Continue present medications. Resume Eliquis (apixaban) at prior dose in 3 days. No Aspirin, Ibuprofen, Naproxen, or other  non-steroidal anti-inflammatory drugs for 2 weeks.  Please see handouts given to you by your recovery nurse.  Thank you for allowing [place] to provide for your healthcare needs today.  ACTIVITY:  You should plan to take it easy for the rest of today and you should NOT DRIVE or use heavy machinery until tomorrow (because of the sedation medicines used during the test).    FOLLOW UP: Our staff will call the number listed on your records 48-72 hours following your procedure to check on you and address any questions or concerns that you may have regarding the information given to you following your procedure. If we do not reach you, we will leave a message.  We will attempt to reach you two times.  During this call, we will ask if you have developed any symptoms of COVID 19. If you develop any symptoms (ie: fever, flu-like symptoms, shortness of breath, cough etc.) before then, please call [phone].  If you test positive for Covid 19 in the 2 weeks post procedure, please call and report this information to [place].    If any biopsies were taken you will be contacted by phone or by letter within the next 1-3 weeks.  Please call us at [phone] if you have not heard about the biopsies in 3 weeks.    SIGNATURES/CONFIDENTIALITY: You and/or your care partner have signed paperwork which will be entered into your electronic medical record.  These signatures attest to the fact that  that the information above on your After Visit Summary has been reviewed and is understood.  Full responsibility of the confidentiality of this discharge information lies with you and/or your care-partner.

## 2021-01-23 NOTE — Progress Notes (Signed)
Called to room to assist during endoscopic procedure.  Patient ID and intended procedure confirmed with present staff. Received instructions for my participation in the procedure from the performing physician.  

## 2021-01-23 NOTE — Progress Notes (Signed)
Report to PACU, RN, vss, BBS= Clear.  

## 2021-01-27 ENCOUNTER — Telehealth: Payer: Self-pay | Admitting: *Deleted

## 2021-01-27 NOTE — Telephone Encounter (Signed)
  Follow up Call-  Call back number 01/23/2021 11/27/2019  Post procedure Call Back phone  # [phone]  Permission to leave phone message Yes Yes  Some recent data might be hidden     Patient questions:  Do you have a fever, pain , or abdominal swelling? No. Pain Score  0 *  Have you tolerated food without any problems? Yes.    Have you been able to return to your normal activities? Yes.    Do you have any questions about your discharge instructions: Diet   No. Medications  No. Follow up visit  No.  Do you have questions or concerns about your Care? No.  Actions: * If pain score is 4 or above: No action needed, pain <4.  Have you developed a fever since your procedure? no  2.   Have you had an respiratory symptoms (SOB or cough) since your procedure? no  3.   Have you tested positive for COVID 19 since your procedure no  4.   Have you had any family members/close contacts diagnosed with the COVID 19 since your procedure?  no   If yes to any of these questions please route to Joylene John, RN and Joella Prince, RN

## 2021-02-11 ENCOUNTER — Encounter: Payer: Self-pay | Admitting: Gastroenterology

## 2021-03-19 ENCOUNTER — Other Ambulatory Visit: Payer: Self-pay | Admitting: Cardiology

## 2021-03-25 ENCOUNTER — Telehealth: Payer: Self-pay | Admitting: Cardiology

## 2021-03-25 NOTE — Telephone Encounter (Signed)
Pt is aware of samples and will complete Eliquis pt assistance as well as call and inform them she is in the doughnut hole.

## 2021-03-25 NOTE — Telephone Encounter (Signed)
Patient calling the office for samples of medication:   1.  What medication and dosage are you requesting samples for? ELIQUIS 5 MG TABS tablet  2.  Are you currently out of this medication?   No, patient states yesterday she went and picked up her 1 month supply, but it was very expensive because she is in the donut hole. She states when her 1 month supply runs out, she won't have enough medication to last her until her appointment on 12/27 with Dr. Geraldo Pitter.

## 2021-03-25 NOTE — Telephone Encounter (Signed)
Left VM to call back 

## 2021-04-11 ENCOUNTER — Other Ambulatory Visit: Payer: Self-pay | Admitting: Gastroenterology

## 2021-05-23 DIAGNOSIS — F1910 Other psychoactive substance abuse, uncomplicated: Secondary | ICD-10-CM | POA: Insufficient documentation

## 2021-06-10 ENCOUNTER — Other Ambulatory Visit: Payer: Self-pay

## 2021-06-17 ENCOUNTER — Other Ambulatory Visit: Payer: Self-pay

## 2021-06-17 ENCOUNTER — Ambulatory Visit: Payer: Medicare Other | Admitting: Cardiology

## 2021-06-17 ENCOUNTER — Encounter: Payer: Self-pay | Admitting: Cardiology

## 2021-06-17 VITALS — BP 122/78 | HR 96 | Ht 66.0 in | Wt 184.0 lb

## 2021-06-17 DIAGNOSIS — I10 Essential (primary) hypertension: Secondary | ICD-10-CM

## 2021-06-17 DIAGNOSIS — I480 Paroxysmal atrial fibrillation: Secondary | ICD-10-CM | POA: Diagnosis not present

## 2021-06-17 DIAGNOSIS — E782 Mixed hyperlipidemia: Secondary | ICD-10-CM | POA: Diagnosis not present

## 2021-06-17 NOTE — Progress Notes (Signed)
Cardiology Office Note:    Date:  06/17/2021   ID:  Caroline Dean, DOB [birth year], MRN [id number]  PCP:  [name], MD  Cardiologist:  Jenean Lindau, MD   Referring MD: [name], MD    ASSESSMENT:    1. PAF (paroxysmal atrial fibrillation) ([place])   2. Essential hypertension   3. Mixed hyperlipidemia    PLAN:    In order of problems listed above:  Primary prevention stressed with patient.  Importance of compliance with diet medication stressed and she vocalized understanding. Paroxysmal atrial fibrillation:I discussed with the patient atrial fibrillation, disease process. Management and therapy including rate and rhythm control, anticoagulation benefits and potential risks were discussed extensively with the patient. Patient had multiple questions which were answered to patient's satisfaction.  I had a lengthy discussion with the patient about atrial fibrillation.  She states she is totally asymptomatic.  She golfs and also goes bowling on a regular basis without any symptoms.  For this reason I would not intervene on any of her medications. Essential hypertension: Blood pressure stable and diet was emphasized.  Lifestyle modification urged. Mixed dyslipidemia: On statin therapy.  Lipids managed by primary care.  Diet emphasized. Patient will be seen in follow-up appointment in 6 months or earlier if the patient has any concerns    Medication Adjustments/Labs and Tests Ordered: Current medicines are reviewed at length with the patient today.  Concerns regarding medicines are outlined above.  Orders Placed This Encounter  Procedures   EKG 12-Lead   No orders of the defined types were placed in this encounter.    No chief complaint on file.    History of Present Illness:    Caroline Dean is a 80 y.o. female.  Patient has past medical history of essential hypertension, dyslipidemia and paroxysmal atrial fibrillation.  She denies any problems at this  time and takes care of activities of daily living.  No chest pain orthopnea or PND.  At the time of my evaluation, the patient is alert awake oriented and in no distress.  Past Medical History:  Diagnosis Date   Age-related osteoporosis without current pathological fracture 10/28/2015   AGITATION 08/15/2007   Qualifier: Diagnosis of  By: Roxan Hockey, [place]     Anxiety 10/28/2015   Atrial fibrillation ([place]) 12/13/2019   Calculus of gallbladder without cholecystitis without obstruction 08/28/2020   Cataract    Diverticulosis    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/2007   Qualifier: Diagnosis of  By: Roxan Hockey, [place]     Dyslipidemia 01/24/2015   Dysphagia    Essential hypertension 12/14/2019   Fatty liver    FATTY LIVER DISEASE 05/11/2008   Qualifier: Diagnosis of  By: [name] MD, Lowella Bandy    Gastro-esophageal reflux disease with esophagitis 10/28/2015   Gastroesophageal reflux disease 08/15/2007   Qualifier: Diagnosis of  By: Roxan Hockey, [place]     GERD (gastroesophageal reflux disease)    High risk medication use 10/28/2015   Hyperlipemia    Hyperplastic colon polyp    Hypertension    Internal hemorrhoids    Iron deficiency anemia, unspecified    Malaise and fatigue 10/28/2015   Mixed hyperlipidemia 10/28/2015   Nephrolithiasis 10/28/2015   Osteoarthritis    PAF (paroxysmal atrial fibrillation) ([place]) 01/18/2020   Palpitations 01/24/2015   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   Prediabetes 05/17/2019   Primary osteoarthritis involving multiple joints 10/28/2015   S/P Nissen fundoplication (without gastrostomy tube) procedure 07/14/2019  Shortness of breath 01/24/2015   Substance abuse ([place])    Tachycardia    Ulcerative colitis ([place]) 08/15/2007   Qualifier: Diagnosis of  By: Roxan Hockey, [place]     Vitamin B12 deficiency    Vitamin D deficiency 10/28/2015    Past Surgical History:  Procedure Laterality Date   BIOPSY  06/19/2019   Procedure:  BIOPSY;  Surgeon: Mauri Pole, MD;  Location: [facility];  Service: Endoscopy;;   COLONOSCOPY  01/23/2021   numerous due to UC--2019- Dr [name], not in epic,  2013, 2010,2007, 2004 and [place] N/A 06/19/2019   Procedure: ESOPHAGEAL MANOMETRY (EM);  Surgeon: Mauri Pole, MD;  Location: [facility];  Service: Endoscopy;  Laterality: N/A;   ESOPHAGOGASTRODUODENOSCOPY (EGD) WITH PROPOFOL N/A 06/19/2019   Procedure: ESOPHAGOGASTRODUODENOSCOPY (EGD) WITH PROPOFOL;  Surgeon: Mauri Pole, MD;  Location: [facility];  Service: Endoscopy;  Laterality: N/A;  with manometry probe placement   HIATAL HERNIA REPAIR     January 2021   TONSILLECTOMY AND ADENOIDECTOMY      Current Medications: Current Meds  Medication Sig   ALPRAZolam (XANAX) 0.25 MG tablet Take 0.25 mg by mouth daily as needed for anxiety.    amLODipine (NORVASC) 5 MG tablet Take 1 tablet by mouth daily.   Cholecalciferol 25 MCG (1000 UT) tablet Take 3,000 Units by mouth daily.    Cyanocobalamin 1000 MCG/ML KIT Inject 1,000 mcg as directed every 30 (thirty) days.   diltiazem (CARDIZEM CD) 120 MG 24 hr capsule TAKE 1 CAPSULE BY MOUTH EVERY DAY   ELIQUIS 5 MG TABS tablet Take 1 tablet (5 mg total) by mouth 2 (two) times daily.   ferrous sulfate 325 (65 FE) MG tablet Take 325 mg by mouth 3 (three) times a week.    folic acid (FOLVITE) 1 MG tablet Take 1 mg by mouth daily.   metoprolol tartrate (LOPRESSOR) 50 MG tablet Take 25 mg by mouth 2 (two) times daily.   omeprazole (PRILOSEC) 20 MG capsule Take 20 mg by mouth daily.   simvastatin (ZOCOR) 20 MG tablet Take 20 mg by mouth daily.    sodium chloride (OCEAN) 0.65 % SOLN nasal spray Place 1 spray into both nostrils as needed for congestion.   sulfaSALAzine (AZULFIDINE) 500 MG tablet TAKE 2 TABLETS BY MOUTH TWICE DAILY   Current Facility-Administered Medications for the 06/17/21 encounter (Office Visit)  with [facility], Reita Cliche, MD  Medication   0.9 %  sodium chloride infusion     Allergies:   Codeine   Social History   Socioeconomic History   Marital status: Single    Spouse name: Not on file   Number of children: 0   Years of education: Not on file   Highest education level: Not on file  Occupational History   Occupation: retired  Tobacco Use   Smoking status: Never    Passive exposure: Never   Smokeless tobacco: Never  Vaping Use   Vaping Use: Never used  Substance and Sexual Activity   Alcohol use: Yes    Comment: occasional   Drug use: No   Sexual activity: Not on file  Other Topics Concern   Not on file  Social History Narrative   Not on file   Social Determinants of Health   Financial Resource Strain: Not on file  Food Insecurity: Not on file  Transportation Needs: Not on file  Physical Activity: Not on file  Stress: Not on file  Social Connections: Not on file     Family History: The patient's family history includes Cirrhosis in an other family member; Diabetes in her mother; Heart disease in her father; Lymphoma in her brother. There is no history of Colon cancer.  ROS:   Please see the history of present illness.    All other systems reviewed and are negative.  EKGs/Labs/Other Studies Reviewed:    The following studies were reviewed today: EKG reveals atrial fibrillation with mildly elevated ventricular rate.   Recent Labs: No results found for requested labs within last 8760 hours.  Recent Lipid Panel No results found for: CHOL, TRIG, HDL, CHOLHDL, VLDL, LDLCALC, LDLDIRECT  Physical Exam:    VS:  BP 122/78    Pulse 96    Ht 5' 6"  (1.676 m)    Wt 184 lb (83.5 kg)    SpO2 95%    BMI 29.70 kg/m     Wt Readings from Last 3 Encounters:  06/17/21 184 lb (83.5 kg)  01/23/21 183 lb (83 kg)  12/02/20 182 lb 12.8 oz (82.9 kg)     GEN: Patient is in no acute distress HEENT: Normal NECK: No JVD; No carotid bruits LYMPHATICS: No  lymphadenopathy CARDIAC: Hear sounds regular, 2/6 systolic murmur at the apex. RESPIRATORY:  Clear to auscultation without rales, wheezing or rhonchi  ABDOMEN: Soft, non-tender, non-distended MUSCULOSKELETAL:  No edema; No deformity  SKIN: Warm and dry NEUROLOGIC:  Alert and oriented x 3 PSYCHIATRIC:  Normal affect   Signed, Jenean Lindau, MD  06/17/2021 2:40 PM    [facility]

## 2021-06-17 NOTE — Patient Instructions (Signed)
Medication Instructions:  Your physician recommends that you continue on your current medications as directed. Please refer to the Current Medication list given to you today.  *If you need a refill on your cardiac medications before your next appointment, please call your pharmacy*   Lab Work: None ordered If you have labs (blood work) drawn today and your tests are completely normal, you will receive your results only by: [place] (if you have MyChart) OR A paper copy in the mail If you have any lab test that is abnormal or we need to change your treatment, we will call you to review the results.   Testing/Procedures: None ordered   Follow-Up: At [facility], you and your health needs are our priority.  As part of our continuing mission to provide you with exceptional heart care, we have created designated Provider Care Teams.  These Care Teams include your primary Cardiologist (physician) and Advanced Practice Providers (APPs -  Physician Assistants and Nurse Practitioners) who all work together to provide you with the care you need, when you need it.  We recommend signing up for the patient portal called "MyChart".  Sign up information is provided on this After Visit Summary.  MyChart is used to connect with patients for Virtual Visits (Telemedicine).  Patients are able to view lab/test results, encounter notes, upcoming appointments, etc.  Non-urgent messages can be sent to your provider as well.   To learn more about what you can do with MyChart, go to [email or web address].    Your next appointment:   6 month(s)  The format for your next appointment:   In Person  Provider:   Jyl Heinz, MD   Other Instructions NA

## 2021-07-08 ENCOUNTER — Other Ambulatory Visit: Payer: Self-pay | Admitting: Cardiology

## 2021-07-08 NOTE — Telephone Encounter (Signed)
Prescription refill request for Eliquis received. Indication: Afib  Last office visit: 06/17/21 ([place])  Scr: 0.85 (05/27/21)  Age: 81 Weight: 83.5kg  Appropriate dose and refill sent to requested pharmacy.

## 2021-07-18 IMAGING — RF DG ESOPHAGUS
10 of 13 series · 12 of 24 positions shown · IV contrast (agent unspecified)
Comparison: None.

FLUOROSCOPY TIME:  Fluoroscopy Time:  1 minutes 30 seconds

CLINICAL DATA: 78-year-old female status post Nissen fundoplication

EXAM:
WATER SOLUBLE UPPER GI SERIES
TECHNIQUE: Single-column upper GI series was performed using water soluble
contrast.
CONTRAST:  Gastrografin

[Series 2: fluoro_barium 2fps_bw · 0.17mm/px · 1 of 2 frames shown (1 of 8)]
[frame 1/2]
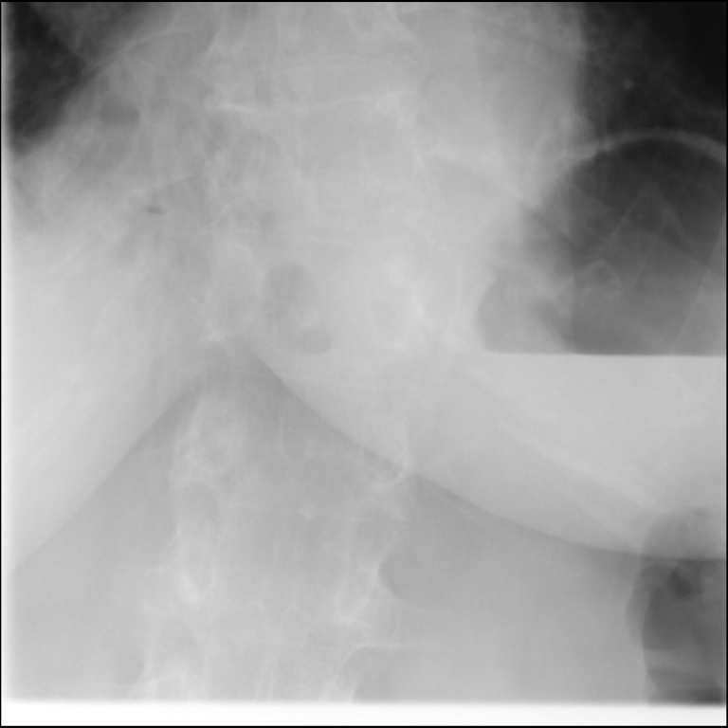

[Series 3: fluoro_barium 2fps_bw · 0.17mm/px · 1 of 2 frames shown (2 of 8)]
[frame 2/2]
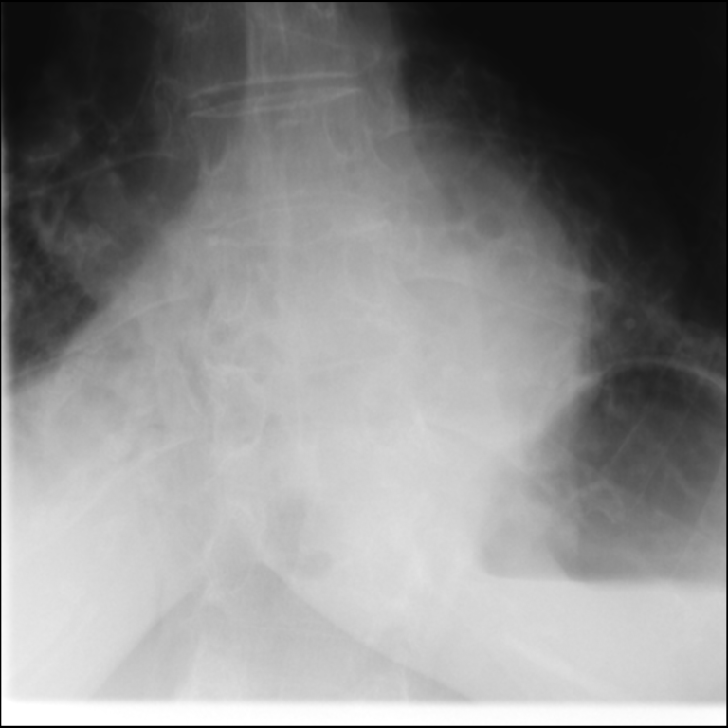

[Series 4: fluoro_barium 2fps_bw · 0.17mm/px · 1 of 10 frames shown (3 of 8)]
[frame 2/10]
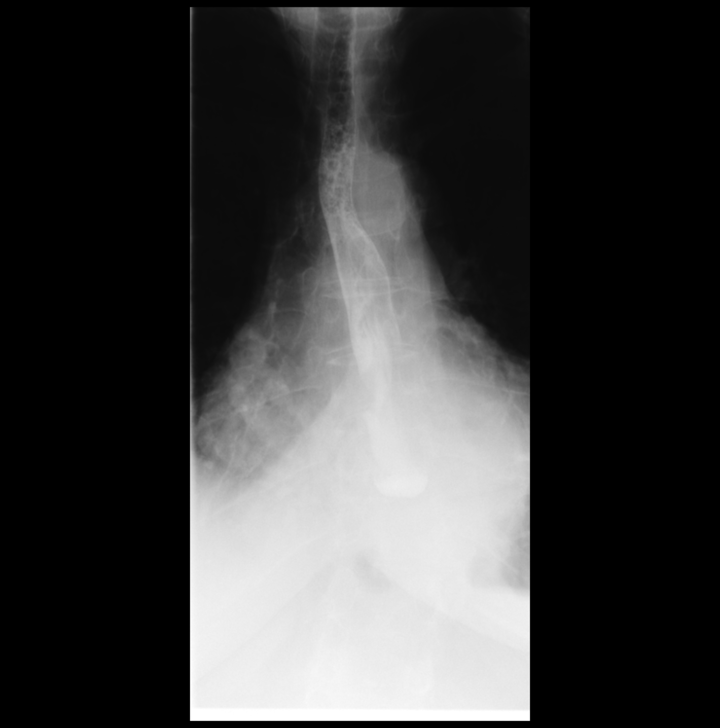

[Series 5: fluoro_barium 2fps_bw · 0.17mm/px · 1 of 2 frames shown (4 of 8)]
[frame 1/2]
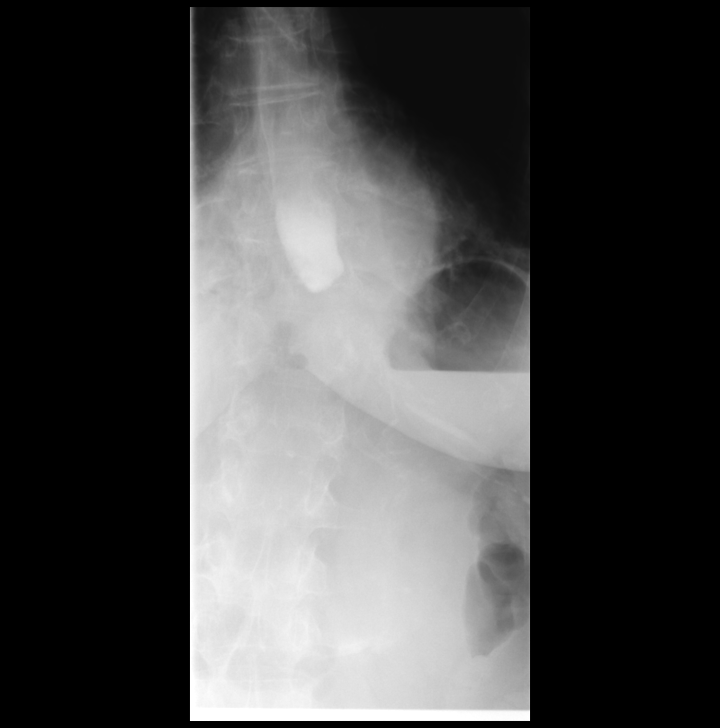

[Series 6: cp_standard · 0.34mm/px · 2 of 192 frames shown (1 of 2)]
[frame 29/192]
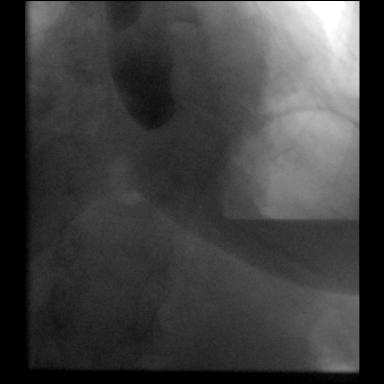
[frame 97/192]
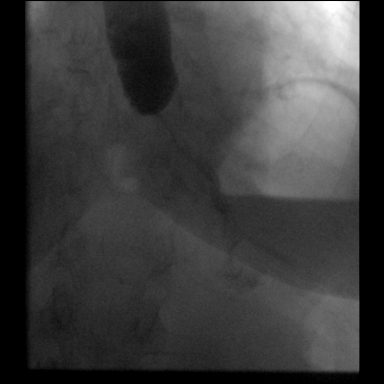

[Series 7: fluoro_barium 2fps_bw · 0.17mm/px · 1 of 6 frames shown (5 of 8)]
[frame 4/6]
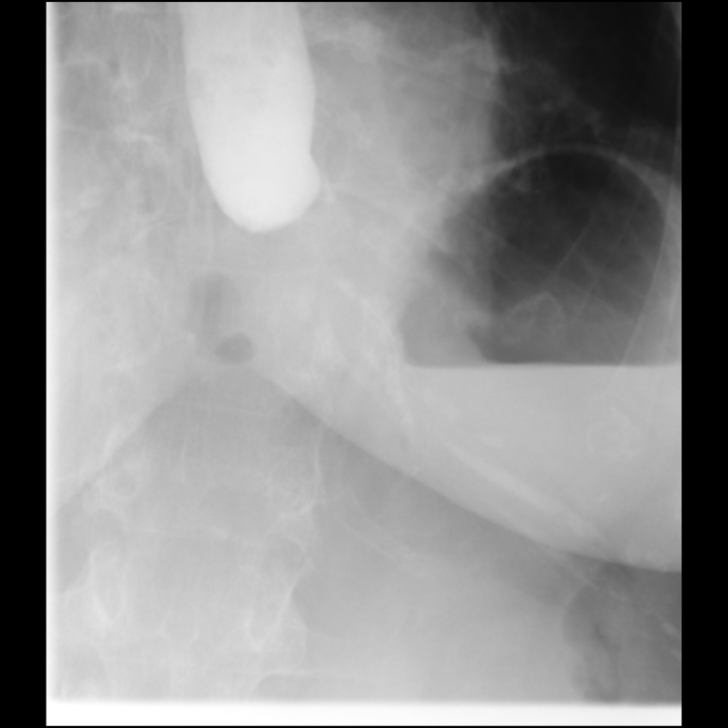

[Series 8: fluoro_barium 2fps_bw · 0.17mm/px · 1 of 2 frames shown (6 of 8)]
[frame 1/2]
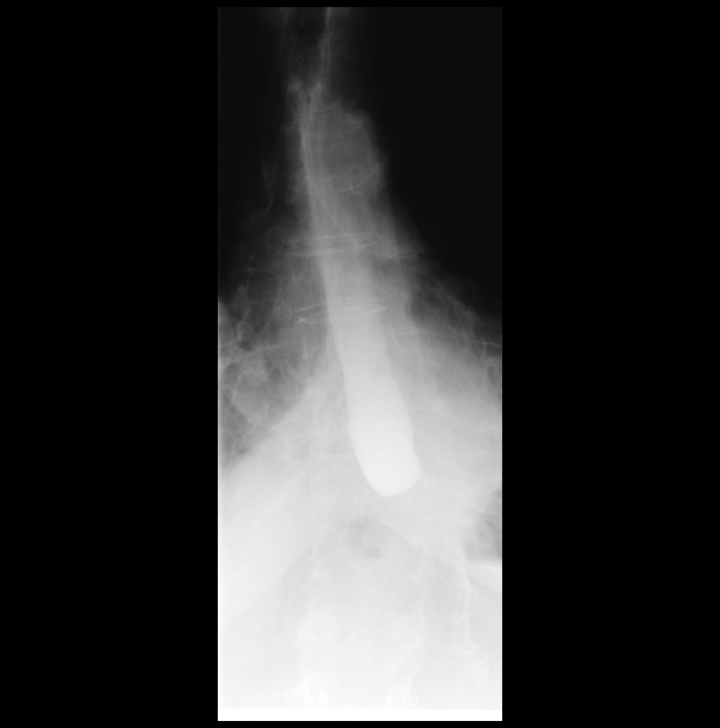

[Series 9: cp_standard · 0.51mm/px · 2 of 59 frames shown (2 of 2)]
[frame 11/59]
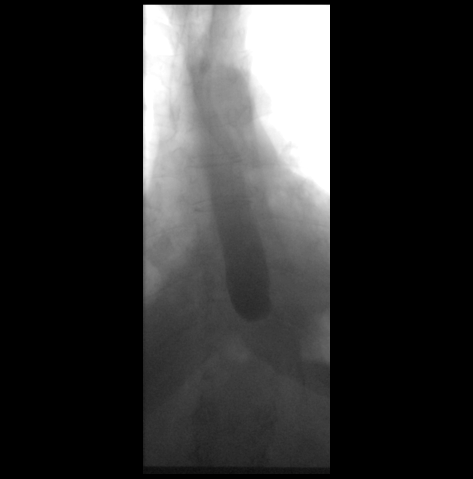
[frame 51/59]
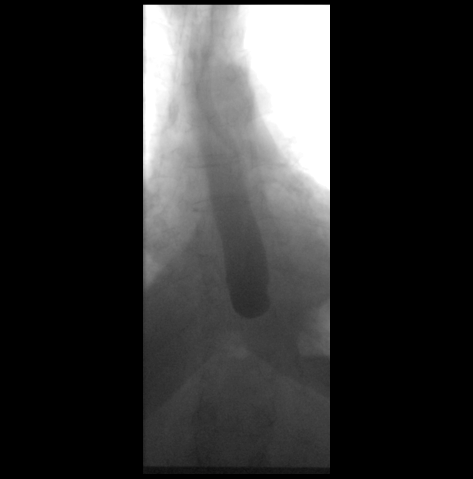

[Series 11: fluoro_barium 2fps_bw · 0.17mm/px · 1 of 2 frames shown (7 of 8)]
[frame 2/2]
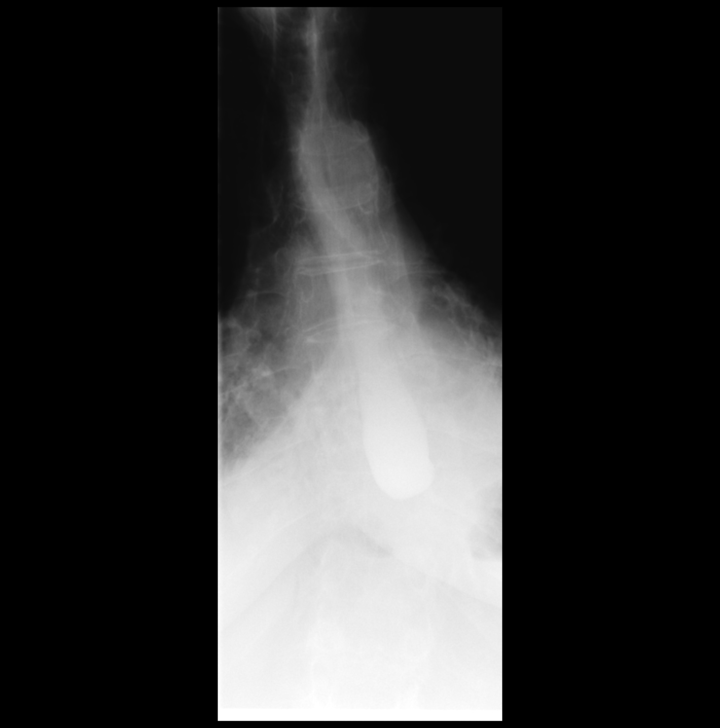

[Series 13: fluoro_barium 2fps_bw · 0.17mm/px · 1 of 1 slices shown (8 of 8)]
[im 1/1]
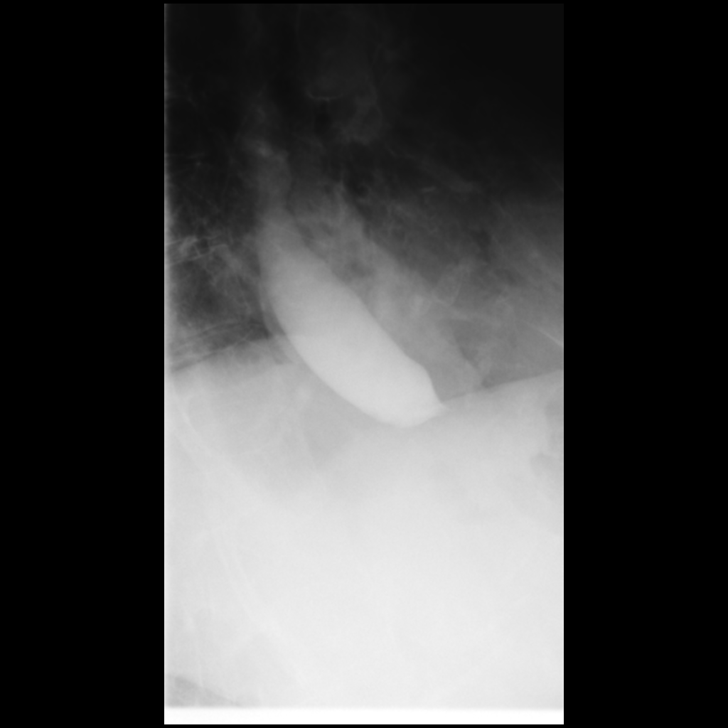

[12 of 24 positions shown; findings below may reference images not displayed]

FINDINGS: Scout images were acquired of the chest and epigastric region
demonstrating no unexpected findings.

PO Gastrografin was administered.

There was rapid transit through the length of the esophagus, with no
nonspecific dysmotility issues identified.

Accumulation of the water-soluble contrast in the distal esophagus
above the surgical site.

We observed contrast to traverse the site of the fundoplication,
with contrast entering the stomach on several occasions during the
study.

At least 5 minutes of observation at the end of the study performed
to observe for any decreased volume of retained contrast in the
distal esophagus. After 5 minutes, there was slight reduced volume
of retained contrast in the distal esophagus (sequence of image 10
to image 11.)

No extraluminal contrast observed.
IMPRESSION: Water-soluble modified swallow study after Nissen fundoplication
demonstrates no evidence extraluminal contrast.

Slow transit of p.o. contrast through the site of fundoplication, as
above, with retained water-soluble contrast at the completion.

These results were discussed at the time of interpretation on

## 2021-09-14 ENCOUNTER — Other Ambulatory Visit: Payer: Self-pay | Admitting: Cardiology

## 2021-09-23 ENCOUNTER — Ambulatory Visit: Payer: Medicare Other | Admitting: Gastroenterology

## 2021-09-23 ENCOUNTER — Encounter: Payer: Self-pay | Admitting: Gastroenterology

## 2021-09-23 ENCOUNTER — Other Ambulatory Visit (INDEPENDENT_AMBULATORY_CARE_PROVIDER_SITE_OTHER): Payer: Medicare Other

## 2021-09-23 VITALS — BP 122/80 | HR 60 | Ht 66.0 in | Wt 184.4 lb

## 2021-09-23 DIAGNOSIS — K51818 Other ulcerative colitis with other complication: Secondary | ICD-10-CM

## 2021-09-23 DIAGNOSIS — R197 Diarrhea, unspecified: Secondary | ICD-10-CM | POA: Diagnosis not present

## 2021-09-23 LAB — CBC WITH DIFFERENTIAL/PLATELET
Basophils Absolute: 0.1 10*3/uL (ref 0.0–0.1)
Basophils Relative: 1.2 % (ref 0.0–3.0)
Eosinophils Absolute: 0.1 10*3/uL (ref 0.0–0.7)
Eosinophils Relative: 1.2 % (ref 0.0–5.0)
HCT: 41.3 % (ref 36.0–46.0)
Hemoglobin: 13.5 g/dL (ref 12.0–15.0)
Lymphocytes Relative: 17.1 % (ref 12.0–46.0)
Lymphs Abs: 1 10*3/uL (ref 0.7–4.0)
MCHC: 32.8 g/dL (ref 30.0–36.0)
MCV: 100.8 fl — ABNORMAL HIGH (ref 78.0–100.0)
Monocytes Absolute: 0.5 10*3/uL (ref 0.1–1.0)
Monocytes Relative: 9.5 % (ref 3.0–12.0)
Neutro Abs: 4 10*3/uL (ref 1.4–7.7)
Neutrophils Relative %: 71 % (ref 43.0–77.0)
Platelets: 205 10*3/uL (ref 150.0–400.0)
RBC: 4.09 Mil/uL (ref 3.87–5.11)
RDW: 13.8 % (ref 11.5–15.5)
WBC: 5.7 10*3/uL (ref 4.0–10.5)

## 2021-09-23 LAB — COMPREHENSIVE METABOLIC PANEL
ALT: 18 U/L (ref 0–35)
AST: 20 U/L (ref 0–37)
Albumin: 4.2 g/dL (ref 3.5–5.2)
Alkaline Phosphatase: 83 U/L (ref 39–117)
BUN: 20 mg/dL (ref 6–23)
CO2: 23 mEq/L (ref 19–32)
Calcium: 9.1 mg/dL (ref 8.4–10.5)
Chloride: 107 mEq/L (ref 96–112)
Creatinine, Ser: 1.01 mg/dL (ref 0.40–1.20)
GFR: 52.84 mL/min — ABNORMAL LOW (ref >60.00)
Glucose, Bld: 99 mg/dL (ref 70–99)
Potassium: 4.6 mEq/L (ref 3.5–5.1)
Sodium: 141 mEq/L (ref 135–145)
Total Bilirubin: 0.8 mg/dL (ref 0.2–1.2)
Total Protein: 6.9 g/dL (ref 6.0–8.3)

## 2021-09-23 LAB — HIGH SENSITIVITY CRP: CRP, High Sensitivity: 7.04 mg/L — ABNORMAL HIGH (ref 0.000–5.000)

## 2021-09-23 LAB — SEDIMENTATION RATE: Sed Rate: 10 mm/hr (ref 0–30)

## 2021-09-23 MED ORDER — PREDNISONE 20 MG PO TABS: ORAL_TABLET | ORAL | 0 refills | Status: DC

## 2021-09-23 NOTE — Patient Instructions (Addendum)
Your provider has requested that you go to the basement level for lab work before leaving today. Press "B" on the elevator. The lab is located at the first door on the left as you exit the elevator.  ? ?We have printed out your Prednisone rx for you to take to your pharmacy ? ?Due to recent changes in healthcare laws, you may see the results of your imaging and laboratory studies on MyChart before your provider has had a chance to review them.  We understand that in some cases there may be results that are confusing or concerning to you. Not all laboratory results come back in the same time frame and the provider may be waiting for multiple results in order to interpret others.  Please give [place] 48 hours in order for your provider to thoroughly review all the results before contacting the office for clarification of your results.   ? ?If you are age 52 or older, your body mass index should be between 23-30. Your Body mass index is 29.76 kg/m?Marland Kitchen If this is out of the aforementioned range listed, please consider follow up with your Primary Care Provider. ? ?If you are age 78 or younger, your body mass index should be between 19-25. Your Body mass index is 29.76 kg/m?Marland Kitchen If this is out of the aformentioned range listed, please consider follow up with your Primary Care Provider.  ? ?________________________________________________________ ? ?The [facility] providers would like to encourage you to use [facility] to communicate with providers for non-urgent requests or questions.  Due to long hold times on the telephone, sending your provider a message by [facility] may be a faster and more efficient way to get a response.  Please allow 48 business hours for a response.  Please remember that this is for non-urgent requests.  ?_______________________________________________________  ? ?I appreciate the  opportunity to care for you ? ?Thank You  ? ?Harl Bowie , MD  ? ? ?

## 2021-09-23 NOTE — Progress Notes (Signed)
? ?       ? ?Caroline Dean    [id number]    1940/08/26 ? ?Primary Care Physician:Grisso, Clarita Crane., MD ? ?Referring Physician: [name], MD ?[facility] [place] ?[place],  [place] [address] ? ? ?Chief complaint:  Ulcerative colitis ? ?HPI: ? ?81 year old very pleasant female with history of large hiatal hernia s/p Nissen fundoplication for follow-up visit for ulcerative proctosigmoiditis.  She was doing well until 6 weeks ago she had a flare of symptoms with watery diarrhea, lower abdominal cramping and mucus per rectum which improved after shot 2-week course of prednisone taper.  She started having diarrhea over the weekend with 45 bowel movements per day with mucus discharge.  Denies any rectal bleeding.  Denies any sick contacts or recent antibiotic use. ? ?She has been tapered off 6-MP.  She is taking sulfasalazine ? ?Colonoscopy January 23, 2021 ?-Three 8 to 14 mm polyps in the cecum, removed with a cold snare. Resected and retrieved. ?- Decreased mucosa vascular pattern in the entire examined colon. ?- Pseudopolyps in the entire examined colon. Biopsied. ?- Diverticulosis in the sigmoid colon. ?- Non-bleeding external and internal hemorrhoids. ?  ?Prior to this colonoscopy by Dr. [name] approximately 3 years ago, is not available for review during this visit. ?She has had multiple surveillance colonoscopies by Dr. [name], most recent in November 2013 showed mild colitis 40 to 70 cm and moderately severe colitis in rectum and sigmoid colon ?  ? ? ?Outpatient Encounter Medications as of 09/23/2021  ?Medication Sig  ? ALPRAZolam (XANAX) 0.25 MG tablet Take 0.25 mg by mouth daily as needed for anxiety.   ? amLODipine (NORVASC) 5 MG tablet Take 1 tablet by mouth daily.  ? Cholecalciferol 25 MCG (1000 UT) tablet Take 3,000 Units by mouth daily.   ? Cyanocobalamin 1000 MCG/ML KIT Inject 1,000 mcg as directed every 30 (thirty) days.  ? diltiazem (CARDIZEM CD) 120 MG 24 hr capsule Take 1 capsule (120 mg total) by  mouth daily.  ? ELIQUIS 5 MG TABS tablet TAKE 1 TABLET(5 MG) BY MOUTH TWICE DAILY  ? ferrous sulfate 325 (65 FE) MG tablet Take 325 mg by mouth 3 (three) times a week.   ? folic acid (FOLVITE) 1 MG tablet Take 1 mg by mouth daily.  ? metoprolol tartrate (LOPRESSOR) 50 MG tablet Take 25 mg by mouth 2 (two) times daily.  ? omeprazole (PRILOSEC) 20 MG capsule Take 20 mg by mouth daily.  ? simvastatin (ZOCOR) 20 MG tablet Take 20 mg by mouth daily.   ? sodium chloride (OCEAN) 0.65 % SOLN nasal spray Place 1 spray into both nostrils as needed for congestion.  ? sulfaSALAzine (AZULFIDINE) 500 MG tablet TAKE 2 TABLETS BY MOUTH TWICE DAILY  ? ?Facility-Administered Encounter Medications as of 09/23/2021  ?Medication  ? 0.9 %  sodium chloride infusion  ? ? ?Allergies as of 09/23/2021 - Review Complete 09/23/2021  ?Allergen Reaction Noted  ? Codeine Nausea And Vomiting 05/10/2008  ? ? ?Past Medical History:  ?Diagnosis Date  ? Age-related osteoporosis without current pathological fracture 10/28/2015  ? AGITATION 08/15/2007  ? Qualifier: Diagnosis of  By: Roxan Hockey, [place]    ? Anxiety 10/28/2015  ? Atrial fibrillation ([place]) 12/13/2019  ? Calculus of gallbladder without cholecystitis without obstruction 08/28/2020  ? Cataract   ? Diverticulosis   ? 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/2007  ? Qualifier: Diagnosis of  By: Roxan Hockey, Norchel    ? Dyslipidemia 01/24/2015  ?  Dysphagia   ? Essential hypertension 12/14/2019  ? Fatty liver   ? FATTY LIVER DISEASE 05/11/2008  ? Qualifier: Diagnosis of  By: [name] MD, Lowella Bandy   ? Gastro-esophageal reflux disease with esophagitis 10/28/2015  ? Gastroesophageal reflux disease 08/15/2007  ? Qualifier: Diagnosis of  By: Roxan Hockey, Norchel    ? GERD (gastroesophageal reflux disease)   ? High risk medication use 10/28/2015  ? Hyperlipemia   ? Hyperplastic colon polyp   ? Hypertension   ? Internal hemorrhoids   ? Iron deficiency anemia, unspecified   ? Malaise and fatigue  10/28/2015  ? Mixed hyperlipidemia 10/28/2015  ? Nephrolithiasis 10/28/2015  ? Osteoarthritis   ? PAF (paroxysmal atrial fibrillation) ([place]) 01/18/2020  ? Palpitations 01/24/2015  ? 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 Prediabetes 05/17/2019  ? Primary osteoarthritis involving multiple joints 10/28/2015  ? S/P Nissen fundoplication (without gastrostomy tube) procedure 07/14/2019  ? Shortness of breath 01/24/2015  ? Substance abuse ([place])   ? Tachycardia   ? Ulcerative colitis ([place]) 08/15/2007  ? Qualifier: Diagnosis of  By: Roxan Hockey, Norchel    ? Vitamin B12 deficiency   ? Vitamin D deficiency 10/28/2015  ? ? ?Past Surgical History:  ?Procedure Laterality Date  ? BIOPSY  06/19/2019  ? Procedure: BIOPSY;  Surgeon: Mauri Pole, MD;  Location: [facility];  Service: Endoscopy;;  ? COLONOSCOPY  01/23/2021  ? numerous due to UC--2019- Dr [name], not in epic,  2013, 2010,2007, 2004 and [place]  ? DILATION AND CURETTAGE OF UTERUS    ? ESOPHAGEAL MANOMETRY N/A 06/19/2019  ? Procedure: ESOPHAGEAL MANOMETRY (EM);  Surgeon: Mauri Pole, MD;  Location: [facility];  Service: Endoscopy;  Laterality: N/A;  ? ESOPHAGOGASTRODUODENOSCOPY (EGD) WITH PROPOFOL N/A 06/19/2019  ? Procedure: ESOPHAGOGASTRODUODENOSCOPY (EGD) WITH PROPOFOL;  Surgeon: Mauri Pole, MD;  Location: [facility];  Service: Endoscopy;  Laterality: N/A;  with manometry probe placement  ? HIATAL HERNIA REPAIR    ? January 2021  ? TONSILLECTOMY AND ADENOIDECTOMY    ? ? ?Family History  ?Problem Relation Age of Onset  ? Lymphoma Brother   ? Diabetes Mother   ? Heart disease Father   ? Cirrhosis Other   ?     neice with liver transplant  ? Colon cancer Neg Hx   ? ? ?Social History  ? ?Socioeconomic History  ? Marital status: Single  ?  Spouse name: Not on file  ? Number of children: 0  ? Years of education: Not on file  ? Highest education level: Not on file  ?Occupational History  ? Occupation: retired  ?Tobacco Use  ? Smoking status:  Never  ?  Passive exposure: Never  ? Smokeless tobacco: Never  ?Vaping Use  ? Vaping Use: Never used  ?Substance and Sexual Activity  ? Alcohol use: Yes  ?  Comment: occasional  ? Drug use: No  ? Sexual activity: Not on file  ?Other Topics Concern  ? Not on file  ?Social History Narrative  ? Not on file  ? ?Social Determinants of Health  ? ?Financial Resource Strain: Not on file  ?Food Insecurity: Not on file  ?Transportation Needs: Not on file  ?Physical Activity: Not on file  ?Stress: Not on file  ?Social Connections: Not on file  ?Intimate Partner Violence: Not on file  ? ? ? ? ?Review of systems: ?All other review of systems negative except as mentioned in the HPI. ? ? ?Physical Exam: ?Vitals:  ? 09/23/21  0759  ?BP: 122/80  ?Pulse: 60  ? ?Body mass index is 29.76 kg/m?. ?Gen:      No acute distress ?HEENT:  sclera anicteric ?Abd:      soft, non-tender; no palpable masses, no distension ?Ext:    No edema ?Neuro: alert and oriented x 3 ?Psych: normal mood and affect ? ?Data Reviewed: ? ?Reviewed labs, radiology imaging, old records and pertinent past GI work up ? ? ?Assessment and Plan/Recommendations: ? ?81 year old very pleasant female with history of large hiatal hernia s/p Nissen fundoplication and hernia repair, left-sided ulcerative colitis initially diagnosed in 1981 with complaints of diarrhea, mucus discharge and lower abdominal cramping concerning for acute flare of ulcerative colitis ? ?She has been in remission for a long time until recent flare.  She was tapered off 6-MP in 2022 due to potential side effects associated with long-term immunosuppressive therapy and advanced age  ?Check CBC, CMP, ESR and CRP ?Check GI pathogen panel and C. difficile to exclude acute infectious etiology causing the UC flare  ? ?Continue sulfasalazine ?If no evidence of acute GI infection, will consider prednisone taper, start prednisone 20 mg daily and taper down by 5 mg every week until finished the course ?  ?GERD s/p  Nissen fundoplication: Transition to Pepcid twice daily as needed, taper off PPI ?Continue antireflux measures and lifestyle changes ?  ?Return in 4 to 6 weeks ?  ?This visit required 40 minutes of patient

## 2021-09-24 ENCOUNTER — Other Ambulatory Visit: Payer: Self-pay | Admitting: Gastroenterology

## 2021-09-26 LAB — GI PROFILE, STOOL, PCR

## 2021-09-26 LAB — CLOSTRIDIUM DIFFICILE BY PCR: Toxigenic C. Difficile by PCR: NEGATIVE

## 2021-10-09 ENCOUNTER — Other Ambulatory Visit: Payer: Self-pay | Admitting: Gastroenterology

## 2021-12-04 ENCOUNTER — Other Ambulatory Visit: Payer: Self-pay

## 2021-12-08 ENCOUNTER — Encounter: Payer: Self-pay | Admitting: Cardiology

## 2021-12-08 ENCOUNTER — Ambulatory Visit: Payer: Medicare Other | Admitting: Cardiology

## 2021-12-08 VITALS — BP 140/84 | HR 74 | Ht 66.0 in | Wt 190.0 lb

## 2021-12-08 DIAGNOSIS — I480 Paroxysmal atrial fibrillation: Secondary | ICD-10-CM

## 2021-12-08 DIAGNOSIS — E785 Hyperlipidemia, unspecified: Secondary | ICD-10-CM | POA: Diagnosis not present

## 2021-12-08 DIAGNOSIS — E669 Obesity, unspecified: Secondary | ICD-10-CM

## 2021-12-08 DIAGNOSIS — I10 Essential (primary) hypertension: Secondary | ICD-10-CM | POA: Diagnosis not present

## 2021-12-08 DIAGNOSIS — E66811 Obesity, class 1: Secondary | ICD-10-CM

## 2021-12-08 HISTORY — DX: Obesity, unspecified: E66.9

## 2021-12-08 HISTORY — DX: Obesity, class 1: E66.811

## 2021-12-08 MED ORDER — LOSARTAN POTASSIUM 50 MG PO TABS: 50.0000 mg | ORAL_TABLET | Freq: Every day | ORAL | 3 refills | Status: DC

## 2021-12-08 NOTE — Progress Notes (Signed)
Cardiology Office Note:    Date:  12/08/2021   ID:  Caroline Dean, DOB [birth year], MRN [id number]  PCP:  Mayer Camel, NP  Cardiologist:  Jenean Lindau, MD   Referring MD: [name], MD    ASSESSMENT:    1. PAF (paroxysmal atrial fibrillation) ([place])   2. Essential hypertension   3. Dyslipidemia   4. Obesity (BMI 30.0-34.9)    PLAN:    In order of problems listed above:  Primary prevention stressed with the patient.  Importance of compliance with diet medication stressed and she vocalized understanding.  She was advised to walk at least half an hour a day 5 days a week and she promises to do so. Paroxysmal atrial fibrillation:I discussed with the patient atrial fibrillation, disease process. Management and therapy including rate and rhythm control, anticoagulation benefits and potential risks were discussed extensively with the patient. Patient had multiple questions which were answered to patient's satisfaction. Essential hypertension and pedal edema: I made the following recommendations.  Lifestyle modification urged.  I discontinued amlodipine.  I will start losartan 50 mg daily.  She will keep a track of her blood pressures and bring it to me in 2 weeks.  She will have a Chem-7 liver lipid check at that time also. Mixed dyslipidemia: Diet emphasized.  Lifestyle modification urged and she promises to do better.  Weight reduction stressed risks of obesity explained. Patient will be seen in follow-up appointment in 6 months or earlier if the patient has any concerns    Medication Adjustments/Labs and Tests Ordered: Current medicines are reviewed at length with the patient today.  Concerns regarding medicines are outlined above.  No orders of the defined types were placed in this encounter.  No orders of the defined types were placed in this encounter.    Chief Complaint  Patient presents with   Follow-up     History of Present Illness:     Caroline Dean is a 81 y.o. female.  Patient has past medical history of paroxysmal atrial fibrillation, essential hypertension mixed dyslipidemia and obesity.  She denies any problems at this time and takes care of activities of daily living.  No chest pain orthopnea or PND.  She has bilateral mild pedal edema.  At the time of my evaluation, the patient is alert awake oriented and in no distress.  Past Medical History:  Diagnosis Date   Age-related osteoporosis without current pathological fracture 10/28/2015   AGITATION 08/15/2007   Qualifier: Diagnosis of  By: Roxan Hockey, [place]     Anxiety 10/28/2015   Atrial fibrillation ([place]) 12/13/2019   Calculus of gallbladder without cholecystitis without obstruction 08/28/2020   Cataract    Diverticulosis    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/2007   Qualifier: Diagnosis of  By: Roxan Hockey, Norchel     Dyslipidemia 01/24/2015   Dysphagia    Essential hypertension 12/14/2019   Fatty liver    FATTY LIVER DISEASE 05/11/2008   Qualifier: Diagnosis of  By: [name] MD, Lowella Bandy    Gastro-esophageal reflux disease with esophagitis 10/28/2015   Gastroesophageal reflux disease 08/15/2007   Qualifier: Diagnosis of  By: Roxan Hockey, Norchel     GERD (gastroesophageal reflux disease)    High risk medication use 10/28/2015   Hyperlipemia    Hyperplastic colon polyp    Hypertension    Internal hemorrhoids    Iron deficiency anemia, unspecified    Malaise and fatigue 10/28/2015   Mixed hyperlipidemia 10/28/2015  Nephrolithiasis 10/28/2015   Osteoarthritis    PAF (paroxysmal atrial fibrillation) ([place]) 01/18/2020   Palpitations 01/24/2015   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   Prediabetes 05/17/2019   Primary osteoarthritis involving multiple joints 10/28/2015   S/P Nissen fundoplication (without gastrostomy tube) procedure 07/14/2019   Shortness of breath 01/24/2015   Substance abuse ([place])    Tachycardia    Ulcerative colitis  ([place]) 08/15/2007   Qualifier: Diagnosis of  By: Roxan Hockey, Norchel     Vitamin B12 deficiency    Vitamin D deficiency 10/28/2015    Past Surgical History:  Procedure Laterality Date   BIOPSY  06/19/2019   Procedure: BIOPSY;  Surgeon: Mauri Pole, MD;  Location: [facility];  Service: Endoscopy;;   COLONOSCOPY  01/23/2021   numerous due to UC--2019- Dr [name], not in epic,  2013, 2010,2007, 2004 and [place] N/A 06/19/2019   Procedure: ESOPHAGEAL MANOMETRY (EM);  Surgeon: Mauri Pole, MD;  Location: [facility];  Service: Endoscopy;  Laterality: N/A;   ESOPHAGOGASTRODUODENOSCOPY (EGD) WITH PROPOFOL N/A 06/19/2019   Procedure: ESOPHAGOGASTRODUODENOSCOPY (EGD) WITH PROPOFOL;  Surgeon: Mauri Pole, MD;  Location: [facility];  Service: Endoscopy;  Laterality: N/A;  with manometry probe placement   HIATAL HERNIA REPAIR     January 2021   TONSILLECTOMY AND ADENOIDECTOMY      Current Medications: Current Meds  Medication Sig   ALPRAZolam (XANAX) 0.25 MG tablet Take 0.25 mg by mouth daily as needed for anxiety.    amLODipine (NORVASC) 5 MG tablet Take 1 tablet by mouth daily.   Cholecalciferol 25 MCG (1000 UT) tablet Take 3,000 Units by mouth daily.    Cyanocobalamin 1000 MCG/ML KIT Inject 1,000 mcg as directed every 30 (thirty) days.   diltiazem (CARDIZEM CD) 120 MG 24 hr capsule Take 1 capsule (120 mg total) by mouth daily.   ELIQUIS 5 MG TABS tablet TAKE 1 TABLET(5 MG) BY MOUTH TWICE DAILY   ferrous sulfate 325 (65 FE) MG tablet Take 325 mg by mouth 3 (three) times a week.    folic acid (FOLVITE) 1 MG tablet TAKE 1 TABLET BY MOUTH EVERY DAY   metoprolol tartrate (LOPRESSOR) 50 MG tablet Take 25 mg by mouth 2 (two) times daily.   omeprazole (PRILOSEC) 20 MG capsule Take 20 mg by mouth daily.   simvastatin (ZOCOR) 20 MG tablet Take 20 mg by mouth daily.    sodium chloride (OCEAN) 0.65 % SOLN  nasal spray Place 1 spray into both nostrils as needed for congestion.   sulfaSALAzine (AZULFIDINE) 500 MG tablet TAKE 2 TABLETS BY MOUTH TWICE DAILY   Current Facility-Administered Medications for the 12/08/21 encounter (Office Visit) with Roslyn Else, Reita Cliche, MD  Medication   0.9 %  sodium chloride infusion     Allergies:   Codeine   Social History   Socioeconomic History   Marital status: Single    Spouse name: Not on file   Number of children: 0   Years of education: Not on file   Highest education level: Not on file  Occupational History   Occupation: retired  Tobacco Use   Smoking status: Never    Passive exposure: Never   Smokeless tobacco: Never  Vaping Use   Vaping Use: Never used  Substance and Sexual Activity   Alcohol use: Yes    Comment: occasional   Drug use: No   Sexual activity: Not on file  Other Topics Concern  Not on file  Social History Narrative   Not on file   Social Determinants of Health   Financial Resource Strain: Not on file  Food Insecurity: Not on file  Transportation Needs: Not on file  Physical Activity: Not on file  Stress: Not on file  Social Connections: Not on file     Family History: The patient's family history includes Cirrhosis in an other family member; Diabetes in her mother; Heart disease in her father; Lymphoma in her brother. There is no history of Colon cancer.  ROS:   Please see the history of present illness.    All other systems reviewed and are negative.  EKGs/Labs/Other Studies Reviewed:    The following studies were reviewed today: I discussed my findings with the patient at length.   Recent Labs: 09/23/2021: ALT 18; BUN 20; Creatinine, Ser 1.01; Hemoglobin 13.5; Platelets 205.0; Potassium 4.6; Sodium 141  Recent Lipid Panel No results found for: "CHOL", "TRIG", "HDL", "CHOLHDL", "VLDL", "LDLCALC", "LDLDIRECT"  Physical Exam:    VS:  BP 140/84 (BP Location: Right Arm, Patient Position: Sitting, Cuff Size:  Normal)   Pulse 74   Ht 5' 6"  (1.676 m)   Wt 190 lb (86.2 kg)   SpO2 95%   BMI 30.67 kg/m     Wt Readings from Last 3 Encounters:  12/08/21 190 lb (86.2 kg)  09/23/21 184 lb 6.4 oz (83.6 kg)  06/17/21 184 lb (83.5 kg)     GEN: Patient is in no acute distress HEENT: Normal NECK: No JVD; No carotid bruits LYMPHATICS: No lymphadenopathy CARDIAC: Hear sounds regular, 2/6 systolic murmur at the apex. RESPIRATORY:  Clear to auscultation without rales, wheezing or rhonchi  ABDOMEN: Soft, non-tender, non-distended MUSCULOSKELETAL:  No edema; No deformity  SKIN: Warm and dry NEUROLOGIC:  Alert and oriented x 3 PSYCHIATRIC:  Normal affect   Signed, Jenean Lindau, MD  12/08/2021 8:28 AM    [place]

## 2021-12-08 NOTE — Patient Instructions (Addendum)
Medication Instructions:  Your physician has recommended you make the following change in your medication:   Stop Amlodipine  Start Losartan 50 mg daily. Keep a BP log times for 2 weeks.  *If you need a refill on your cardiac medications before your next appointment, please call your pharmacy*   Lab Work: Your physician recommends that you return for lab work in: 2 weeks You need to have labs done when you are fasting.  You can come Monday through Friday 8:30 am to 12:00 pm and 1:15 to 4:30. You do not need to make an appointment as the order has already been placed. The labs you are going to have done are BMET, LFT and Lipids.  If you have labs (blood work) drawn today and your tests are completely normal, you will receive your results only by: [place] (if you have MyChart) OR A paper copy in the mail If you have any lab test that is abnormal or we need to change your treatment, we will call you to review the results.   Testing/Procedures: Your physician has requested that you have an echocardiogram. Echocardiography is a painless test that uses sound waves to create images of your heart. It provides your doctor with information about the size and shape of your heart and how well your heart's chambers and valves are working. This procedure takes approximately one hour. There are no restrictions for this procedure.    Follow-Up: At [facility], you and your health needs are our priority.  As part of our continuing mission to provide you with exceptional heart care, we have created designated Provider Care Teams.  These Care Teams include your primary Cardiologist (physician) and Advanced Practice Providers (APPs -  Physician Assistants and Nurse Practitioners) who all work together to provide you with the care you need, when you need it.  We recommend signing up for the patient portal called "MyChart".  Sign up information is provided on this After Visit Summary.  MyChart is  used to connect with patients for Virtual Visits (Telemedicine).  Patients are able to view lab/test results, encounter notes, upcoming appointments, etc.  Non-urgent messages can be sent to your provider as well.   To learn more about what you can do with MyChart, go to [email or web address].    Your next appointment:   6 month(s)  The format for your next appointment:   In Person  Provider:   Jyl Heinz, MD   Other Instructions Echocardiogram An echocardiogram is a test that uses sound waves (ultrasound) to produce images of the heart. Images from an echocardiogram can provide important information about: Heart size and shape. The size and thickness and movement of your heart's walls. Heart muscle function and strength. Heart valve function or if you have stenosis. Stenosis is when the heart valves are too narrow. If blood is flowing backward through the heart valves (regurgitation). A tumor or infectious growth around the heart valves. Areas of heart muscle that are not working well because of poor blood flow or injury from a heart attack. Aneurysm detection. An aneurysm is a weak or damaged part of an artery wall. The wall bulges out from the normal force of blood pumping through the body. Tell a health care provider about: Any allergies you have. All medicines you are taking, including vitamins, herbs, eye drops, creams, and over-the-counter medicines. Any blood disorders you have. Any surgeries you have had. Any medical conditions you have. Whether you are pregnant or may be pregnant. What  are the risks? Generally, this is a safe test. However, problems may occur, including an allergic reaction to dye (contrast) that may be used during the test. What happens before the test? No specific preparation is needed. You may eat and drink normally. What happens during the test?  You will take off your clothes from the waist up and put on a hospital gown. Electrodes or  electrocardiogram (ECG)patches may be placed on your chest. The electrodes or patches are then connected to a device that monitors your heart rate and rhythm. You will lie down on a table for an ultrasound exam. A gel will be applied to your chest to help sound waves pass through your skin. A handheld device, called a transducer, will be pressed against your chest and moved over your heart. The transducer produces sound waves that travel to your heart and bounce back (or "echo" back) to the transducer. These sound waves will be captured in real-time and changed into images of your heart that can be viewed on a video monitor. The images will be recorded on a computer and reviewed by your health care provider. You may be asked to change positions or hold your breath for a short time. This makes it easier to get different views or better views of your heart. In some cases, you may receive contrast through an IV in one of your veins. This can improve the quality of the pictures from your heart. The procedure may vary among health care providers and hospitals. What can I expect after the test? You may return to your normal, everyday life, including diet, activities, and medicines, unless your health care provider tells you not to do that. Follow these instructions at home: It is up to you to get the results of your test. Ask your health care provider, or the department that is doing the test, when your results will be ready. Keep all follow-up visits. This is important. Summary An echocardiogram is a test that uses sound waves (ultrasound) to produce images of the heart. Images from an echocardiogram can provide important information about the size and shape of your heart, heart muscle function, heart valve function, and other possible heart problems. You do not need to do anything to prepare before this test. You may eat and drink normally. After the echocardiogram is completed, you may return to your  normal, everyday life, unless your health care provider tells you not to do that. This information is not intended to replace advice given to you by your health care provider. Make sure you discuss any questions you have with your health care provider. Document Revised: 02/19/2021 Document Reviewed: 01/30/2020 Elsevier Patient Education  [place].  Losartan Tablets What is this medication? LOSARTAN (loe SAR tan) treats high blood pressure. It may also be used to prevent a stroke in people with heart disease and high blood pressure. It can be used to prevent kidney damage in people with diabetes. It works by relaxing the blood vessels, which helps decrease the amount of work your heart has to do. It belongs to a group of medications called ARBs. This medicine may be used for other purposes; ask your health care provider or pharmacist if you have questions. COMMON BRAND NAME(S): Cozaar What should I tell my care team before I take this medication? They need to know if you have any of these conditions: Heart failure Kidney disease Liver disease An unusual or allergic reaction to losartan, other medications, foods, dyes, or preservatives  Pregnant or trying to get pregnant Breast-feeding How should I use this medication? Take this medication by mouth. Take it as directed on the prescription label at the same time every day. You can take it with or without food. If it upsets your stomach, take it with food. Keep taking it unless your care team tells you to stop. Talk to your care team about the use of this medication in children. While it may be prescribed for children as young as 6 for selected conditions, precautions do apply. Overdosage: If you think you have taken too much of this medicine contact a poison control center or emergency room at once. NOTE: This medicine is only for you. Do not share this medicine with others. What if I miss a dose? If you miss a dose, take it as soon as  you can. If it is almost time for your next dose, take only that dose. Do not take double or extra doses. What may interact with this medication? Aliskiren ACE inhibitors, like enalapril or lisinopril Diuretics, especially amiloride, eplerenone, spironolactone, or triamterene Lithium NSAIDs, medications for pain and inflammation, like ibuprofen or naproxen Potassium salts or potassium supplements This list may not describe all possible interactions. Give your health care provider a list of all the medicines, herbs, non-prescription drugs, or dietary supplements you use. Also tell them if you smoke, drink alcohol, or use illegal drugs. Some items may interact with your medicine. What should I watch for while using this medication? Visit your care team for regular check ups. Check your blood pressure as directed. Ask your care team what your blood pressure should be. Also, find out when you should contact them. Do not treat yourself for coughs, colds, or pain while you are using this medication without asking your care team for advice. Some medications may increase your blood pressure. Women should inform their care team if they wish to become pregnant or think they might be pregnant. There is a potential for serious side effects to an unborn child. Talk to your care team for more information. You may get drowsy or dizzy. Do not drive, use machinery, or do anything that needs mental alertness until you know how this medication affects you. Do not stand or sit up quickly, especially if you are an older patient. This reduces the risk of dizzy or fainting spells. Alcohol can make you more drowsy and dizzy. Avoid alcoholic drinks. Avoid salt substitutes unless you are told otherwise by your care team. What side effects may I notice from receiving this medication? Side effects that you should report to your care team as soon as possible: Allergic reactions--skin rash, itching, hives, swelling of the face,  lips, tongue, or throat High potassium level--muscle weakness, fast or irregular heartbeat Kidney injury--decrease in the amount of urine, swelling of the ankles, hands, or feet Low blood pressure--dizziness, feeling faint or lightheaded, blurry vision Side effects that usually do not require medical attention (report to your care team if they continue or are bothersome): Dizziness Headache Runny or stuffy nose This list may not describe all possible side effects. Call your doctor for medical advice about side effects. You may report side effects to FDA at 1-800-FDA-1088. Where should I keep my medication? Keep out of the reach of children and pets. Store at room temperature between 20 and 25 degrees C (68 and 77 degrees F). Protect from light. Keep the container tightly closed. Get rid of any unused medication after the expiration date. To get rid  of medications that are no longer needed or have expired: Take the medication to a medication take-back program. Check with your pharmacy or law enforcement to find a location. If you cannot return the medication, check the label or package insert to see if the medication should be thrown out in the garbage or flushed down the toilet. If you are not sure, ask your care team. If it is safe to put in the trash, empty the medication out of the container. Mix the medication with cat litter, dirt, coffee grounds, or other unwanted substance. Seal the mixture in a bag or container. Put it in the trash. NOTE: This sheet is a summary. It may not cover all possible information. If you have questions about this medicine, talk to your doctor, pharmacist, or health care provider.  2023 Elsevier/Gold Standard (2021-05-09 00:00:00)  Please keep a BP log for 2 weeks and send by MyChart or mail.  Blood Pressure Record Sheet To take your blood pressure, you will need a blood pressure machine. You can buy a blood pressure machine (blood pressure monitor) at your  clinic, drug store, or online. When choosing one, consider: An automatic monitor that has an arm cuff. A cuff that wraps snugly around your upper arm. You should be able to fit only one finger between your arm and the cuff. A device that stores blood pressure reading results. Do not choose a monitor that measures your blood pressure from your wrist or finger. Follow your health care provider's instructions for how to take your blood pressure. To use this form: Get one reading in the morning (a.m.) 1-2 hours after you take any medicines. Get one reading in the evening (p.m.) before supper. Write down the results in the spaces on this form. Repeat this once a week, or as told by your health care provider.  Make a follow-up appointment with your health care provider to discuss the results. Blood pressure log Date: _______________________ a.m. _____________________(1st reading) HR___________            p.m. _____________________(2nd reading) HR__________  Date: _______________________ a.m. _____________________(1st reading) HR___________            p.m. _____________________(2nd reading) HR__________ Date: _______________________ a.m. _____________________(1st reading) HR___________            p.m. _____________________(2nd reading) HR__________ Date: _______________________ a.m. _____________________(1st reading) HR___________            p.m. _____________________(2nd reading) HR__________  Date: _______________________ a.m. _____________________(1st reading) HR___________            p.m. _____________________(2nd reading) HR__________  Date: _______________________ a.m. _____________________(1st reading) HR___________            p.m. _____________________(2nd reading) HR__________  Date: _______________________ a.m. _____________________(1st reading) HR___________            p.m. _____________________(2nd reading) HR__________   This information is not intended to replace  advice given to you by your health care provider. Make sure you discuss any questions you have with your health care provider. Document Revised: 09/27/2019 Document Reviewed: 09/27/2019 Elsevier Patient Education  2021 [employer].

## 2021-12-09 ENCOUNTER — Ambulatory Visit: Payer: Medicare Other | Admitting: Gastroenterology

## 2021-12-09 ENCOUNTER — Encounter: Payer: Self-pay | Admitting: Gastroenterology

## 2021-12-09 ENCOUNTER — Other Ambulatory Visit (INDEPENDENT_AMBULATORY_CARE_PROVIDER_SITE_OTHER): Payer: Medicare Other

## 2021-12-09 VITALS — BP 120/72 | HR 95 | Ht 66.0 in | Wt 189.0 lb

## 2021-12-09 DIAGNOSIS — K5180 Other ulcerative colitis without complications: Secondary | ICD-10-CM | POA: Diagnosis not present

## 2021-12-09 DIAGNOSIS — E782 Mixed hyperlipidemia: Secondary | ICD-10-CM | POA: Diagnosis not present

## 2021-12-09 DIAGNOSIS — K529 Noninfective gastroenteritis and colitis, unspecified: Secondary | ICD-10-CM | POA: Diagnosis not present

## 2021-12-09 LAB — B12 AND FOLATE PANEL
Folate: >24.2 ng/mL (ref >5.9)
Vitamin B-12: 1034 pg/mL — ABNORMAL HIGH (ref 211–911)

## 2021-12-09 LAB — IBC + FERRITIN
Ferritin: 98.4 ng/mL (ref 10.0–291.0)
Iron: 292 ug/dL — ABNORMAL HIGH (ref 42–145)
Saturation Ratios: 86.5 % — ABNORMAL HIGH (ref 20.0–50.0)
TIBC: 337.4 ug/dL (ref 250.0–450.0)
Transferrin: 241 mg/dL (ref 212.0–360.0)

## 2021-12-09 LAB — HIGH SENSITIVITY CRP: CRP, High Sensitivity: 2.62 mg/L (ref 0.000–5.000)

## 2021-12-09 NOTE — Patient Instructions (Addendum)
Your provider has requested that you go to the basement level for lab work before leaving today. Press "B" on the elevator. The lab is located at the first door on the left as you exit the elevator.   Continue Sulfasalazine and Folic Acid   Use Lactaid  tablets as needed   Due to recent changes in healthcare laws, you may see the results of your imaging and laboratory studies on MyChart before your provider has had a chance to review them.  We understand that in some cases there may be results that are confusing or concerning to you. Not all laboratory results come back in the same time frame and the provider may be waiting for multiple results in order to interpret others.  Please give [place] 48 hours in order for your provider to thoroughly review all the results before contacting the office for clarification of your results.    If you are age 5 or older, your body mass index should be between 23-30. Your Body mass index is 30.51 kg/m. If this is out of the aforementioned range listed, please consider follow up with your Primary Care Provider.  If you are age 70 or younger, your body mass index should be between 19-25. Your Body mass index is 30.51 kg/m. If this is out of the aformentioned range listed, please consider follow up with your Primary Care Provider.   ________________________________________________________  The [facility] providers would like to encourage you to use [facility] to communicate with providers for non-urgent requests or questions.  Due to long hold times on the telephone, sending your provider a message by [facility] may be a faster and more efficient way to get a response.  Please allow 48 business hours for a response.  Please remember that this is for non-urgent requests.  _______________________________________________________   Thank you for choosing [facility]  Kavitha Nandigam,MD

## 2021-12-09 NOTE — Progress Notes (Unsigned)
Caroline Dean    [id number]    12/15/1940  Primary Care Physician:Brown-Patram, Ruthell Rummage, NP  Referring Physician: [name], MD [place],  [place] [address]   Chief complaint:  Ulcerative colitis   HPI:  81 year old very pleasant female with history of large hiatal hernia s/p Nissen fundoplication for follow-up visit for ulcerative proctosigmoiditis.  She was doing well until 6 weeks ago she had a flare of symptoms with watery diarrhea, lower abdominal cramping and mucus per rectum which improved after shot 2-week course of prednisone taper.  She started having diarrhea over the weekend with 45 bowel movements per day with mucus discharge.  Denies any rectal bleeding.  Denies any sick contacts or recent antibiotic use.   She has been tapered off 6-MP.  She is taking sulfasalazine   Colonoscopy January 23, 2021 -Three 8 to 14 mm polyps in the cecum, removed with a cold snare. Resected and retrieved. - Decreased mucosa vascular pattern in the entire examined colon. - Pseudopolyps in the entire examined colon. Biopsied. - Diverticulosis in the sigmoid colon. - Non-bleeding external and internal hemorrhoids.   Prior to this colonoscopy by Dr. [name] approximately 3 years ago, is not available for review during this visit. She has had multiple surveillance colonoscopies by Dr. [name], most recent in November 2013 showed mild colitis 40 to 70 cm and moderately severe colitis in rectum and sigmoid colon       Outpatient Encounter Medications as of 12/09/2021  Medication Sig   ALPRAZolam (XANAX) 0.25 MG tablet Take 0.25 mg by mouth daily as needed for anxiety.    Cholecalciferol 25 MCG (1000 UT) tablet Take 3,000 Units by mouth daily.    Cyanocobalamin 1000 MCG/ML KIT Inject 1,000 mcg as directed every 30 (thirty) days.   diltiazem (CARDIZEM CD) 120 MG 24 hr capsule Take 1 capsule (120 mg total) by mouth daily.   ELIQUIS 5 MG TABS tablet TAKE 1  TABLET(5 MG) BY MOUTH TWICE DAILY   ferrous sulfate 325 (65 FE) MG tablet Take 325 mg by mouth 3 (three) times a week.    folic acid (FOLVITE) 1 MG tablet TAKE 1 TABLET BY MOUTH EVERY DAY   losartan (COZAAR) 50 MG tablet Take 1 tablet (50 mg total) by mouth daily.   metoprolol tartrate (LOPRESSOR) 50 MG tablet Take 25 mg by mouth 2 (two) times daily.   omeprazole (PRILOSEC) 20 MG capsule Take 20 mg by mouth daily.   simvastatin (ZOCOR) 20 MG tablet Take 20 mg by mouth daily.    sodium chloride (OCEAN) 0.65 % SOLN nasal spray Place 1 spray into both nostrils as needed for congestion.   sulfaSALAzine (AZULFIDINE) 500 MG tablet TAKE 2 TABLETS BY MOUTH TWICE DAILY   Facility-Administered Encounter Medications as of 12/09/2021  Medication   0.9 %  sodium chloride infusion    Allergies as of 12/09/2021 - Review Complete 12/09/2021  Allergen Reaction Noted   Codeine Nausea And Vomiting 05/10/2008    Past Medical History:  Diagnosis Date   Age-related osteoporosis without current pathological fracture 10/28/2015   AGITATION 08/15/2007   Qualifier: Diagnosis of  By: Roxan Hockey, [place]     Anxiety 10/28/2015   Atrial fibrillation ([place]) 12/13/2019   Calculus of gallbladder without cholecystitis without obstruction 08/28/2020   Cataract    Diverticulosis    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/2007   Qualifier: Diagnosis of  By: Roxan Hockey,  [place]chel     Dyslipidemia 01/24/2015   Dysphagia    Essential hypertension 12/14/2019   Fatty liver    FATTY LIVER DISEASE 05/11/2008   Qualifier: Diagnosis of  By: [name] MD, Lowella Bandy    Gastro-esophageal reflux disease with esophagitis 10/28/2015   Gastroesophageal reflux disease 08/15/2007   Qualifier: Diagnosis of  By: Roxan Hockey, Nor[place]     GERD (gastroesophageal reflux disease)    High risk medication use 10/28/2015   Hyperlipemia    Hyperplastic colon polyp    Hypertension    Internal hemorrhoids    Iron deficiency anemia,  unspecified    Malaise and fatigue 10/28/2015   Mixed hyperlipidemia 10/28/2015   Nephrolithiasis 10/28/2015   Osteoarthritis    PAF (paroxysmal atrial fibrillation) ([place]) 01/18/2020   Palpitations 01/24/2015   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   Prediabetes 05/17/2019   Primary osteoarthritis involving multiple joints 10/28/2015   S/P Nissen fundoplication (without gastrostomy tube) procedure 07/14/2019   Shortness of breath 01/24/2015   Substance abuse ([place])    Tachycardia    Ulcerative colitis ([place]) 08/15/2007   Qualifier: Diagnosis of  By: Roxan Hockey, Nor[place]     Vitamin B12 deficiency    Vitamin D deficiency 10/28/2015    Past Surgical History:  Procedure Laterality Date   BIOPSY  06/19/2019   Procedure: BIOPSY;  Surgeon: Mauri Pole, MD;  Location: [facility];  Service: Endoscopy;;   COLONOSCOPY  01/23/2021   numerous due to UC--2019- Dr [name], not in epic,  2013, 2010,2007, 2004 and [place] N/A 06/19/2019   Procedure: ESOPHAGEAL MANOMETRY (EM);  Surgeon: Mauri Pole, MD;  Location: [facility];  Service: Endoscopy;  Laterality: N/A;   ESOPHAGOGASTRODUODENOSCOPY (EGD) WITH PROPOFOL N/A 06/19/2019   Procedure: ESOPHAGOGASTRODUODENOSCOPY (EGD) WITH PROPOFOL;  Surgeon: Mauri Pole, MD;  Location: [facility];  Service: Endoscopy;  Laterality: N/A;  with manometry probe placement   HIATAL HERNIA REPAIR     January 2021   TONSILLECTOMY AND ADENOIDECTOMY      Family History  Problem Relation Age of Onset   Lymphoma Brother    Diabetes Mother    Heart disease Father    Cirrhosis Other        neice with liver transplant   Colon cancer Neg Hx     Social History   Socioeconomic History   Marital status: Single    Spouse name: Not on file   Number of children: 0   Years of education: Not on file   Highest education level: Not on file  Occupational History   Occupation:  retired  Tobacco Use   Smoking status: Never    Passive exposure: Never   Smokeless tobacco: Never  Vaping Use   Vaping Use: Never used  Substance and Sexual Activity   Alcohol use: Yes    Comment: occasional   Drug use: No   Sexual activity: Not on file  Other Topics Concern   Not on file  Social History Narrative   Not on file   Social Determinants of Health   Financial Resource Strain: Not on file  Food Insecurity: Not on file  Transportation Needs: Not on file  Physical Activity: Not on file  Stress: Not on file  Social Connections: Not on file  Intimate Partner Violence: Not on file      Review of systems: All other review of systems negative except as mentioned in the  HPI.   Physical Exam: Vitals:   12/09/21 0946  BP: 120/72  Pulse: 95   Body mass index is 30.51 kg/m. Gen:      No acute distress HEENT:  sclera anicteric Abd:      soft, non-tender; no palpable masses, no distension Ext:    No edema Neuro: alert and oriented x 3 Psych: normal mood and affect  Data Reviewed:  Reviewed labs, radiology imaging, old records and pertinent past GI work up   Assessment and Plan/Recommendations:  ***  This visit required *** minutes of patient care (this includes precharting, chart review, review of results, face-to-face time used for counseling as well as treatment plan and follow-up. The patient was provided an opportunity to ask questions and all were answered. The patient agreed with the plan and demonstrated an understanding of the instructions.  Damaris Hippo , MD    CC: [name], MD

## 2021-12-10 ENCOUNTER — Ambulatory Visit (INDEPENDENT_AMBULATORY_CARE_PROVIDER_SITE_OTHER): Payer: Medicare Other

## 2021-12-10 DIAGNOSIS — E785 Hyperlipidemia, unspecified: Secondary | ICD-10-CM | POA: Diagnosis not present

## 2021-12-10 DIAGNOSIS — I480 Paroxysmal atrial fibrillation: Secondary | ICD-10-CM | POA: Diagnosis not present

## 2021-12-10 LAB — ECHOCARDIOGRAM COMPLETE
Area-P 1/2: 3.51 cm2
MV M vel: 5.9 m/s
MV Peak grad: 139.2 mmHg
S' Lateral: 3.2 cm

## 2021-12-11 ENCOUNTER — Telehealth: Payer: Self-pay | Admitting: Gastroenterology

## 2021-12-11 ENCOUNTER — Other Ambulatory Visit: Payer: Self-pay | Admitting: Gastroenterology

## 2021-12-11 NOTE — Telephone Encounter (Signed)
Called the [facility] back. No answer. Left voicemail of my return call. Since a fax number was left, I have faxed the orders. If they call back, please ask specifically what they are needing.

## 2021-12-11 NOTE — Telephone Encounter (Signed)
Inbound call from [facility] at [phone] in [place] to orders for a stool drop off. Please give them a call back to advise. Also left a fax number [phone]

## 2021-12-16 ENCOUNTER — Encounter: Payer: Self-pay | Admitting: Gastroenterology

## 2021-12-18 LAB — CALPROTECTIN, FECAL: Calprotectin, Fecal: 132 ug/g — ABNORMAL HIGH (ref 0–120)

## 2021-12-25 LAB — BASIC METABOLIC PANEL
BUN/Creatinine Ratio: 16 (ref 12–28)
BUN: 12 mg/dL (ref 8–27)
CO2: 21 mmol/L (ref 20–29)
Calcium: 8.5 mg/dL — ABNORMAL LOW (ref 8.7–10.3)
Chloride: 108 mmol/L — ABNORMAL HIGH (ref 96–106)
Creatinine, Ser: 0.75 mg/dL (ref 0.57–1.00)
Glucose: 94 mg/dL (ref 70–99)
Potassium: 4.1 mmol/L (ref 3.5–5.2)
Sodium: 142 mmol/L (ref 134–144)
eGFR: 80 mL/min/{1.73_m2} (ref >59)

## 2021-12-25 LAB — HEPATIC FUNCTION PANEL
ALT: 17 IU/L (ref 0–32)
AST: 17 IU/L (ref 0–40)
Albumin: 4.2 g/dL (ref 3.7–4.7)
Alkaline Phosphatase: 89 IU/L (ref 44–121)
Bilirubin Total: 0.4 mg/dL (ref 0.0–1.2)
Bilirubin, Direct: 0.15 mg/dL (ref 0.00–0.40)
Total Protein: 6.5 g/dL (ref 6.0–8.5)

## 2021-12-25 LAB — LIPID PANEL
Chol/HDL Ratio: 2.5 ratio (ref 0.0–4.4)
Cholesterol, Total: 142 mg/dL (ref 100–199)
HDL: 56 mg/dL (ref >39)
LDL Chol Calc (NIH): 67 mg/dL (ref 0–99)
Triglycerides: 107 mg/dL (ref 0–149)
VLDL Cholesterol Cal: 19 mg/dL (ref 5–40)

## 2022-04-10 ENCOUNTER — Encounter: Payer: Self-pay | Admitting: Gastroenterology

## 2022-04-28 ENCOUNTER — Other Ambulatory Visit: Payer: Self-pay | Admitting: Gastroenterology

## 2022-06-29 ENCOUNTER — Other Ambulatory Visit: Payer: Self-pay

## 2022-07-02 ENCOUNTER — Ambulatory Visit: Payer: Medicare Other | Attending: Cardiology | Admitting: Cardiology

## 2022-07-02 ENCOUNTER — Encounter: Payer: Self-pay | Admitting: Cardiology

## 2022-07-02 VITALS — BP 133/92 | HR 104 | Ht 67.0 in | Wt 187.2 lb

## 2022-07-02 DIAGNOSIS — E669 Obesity, unspecified: Secondary | ICD-10-CM | POA: Diagnosis not present

## 2022-07-02 DIAGNOSIS — E66811 Obesity, class 1: Secondary | ICD-10-CM

## 2022-07-02 DIAGNOSIS — E782 Mixed hyperlipidemia: Secondary | ICD-10-CM | POA: Diagnosis not present

## 2022-07-02 DIAGNOSIS — I10 Essential (primary) hypertension: Secondary | ICD-10-CM | POA: Diagnosis not present

## 2022-07-02 DIAGNOSIS — I480 Paroxysmal atrial fibrillation: Secondary | ICD-10-CM

## 2022-07-02 NOTE — Progress Notes (Signed)
Cardiology Office Note:    Date:  07/02/2022   ID:  Caroline Dean, DOB Dec 13, 1940, MRN [id number]  PCP:  Mayer Camel, NP  Cardiologist:  Jenean Lindau, MD   Referring MD: Bess Harvest*    ASSESSMENT:    1. PAF (paroxysmal atrial fibrillation) ([place])   2. Essential hypertension   3. Obesity (BMI 30.0-34.9)   4. Mixed hyperlipidemia    PLAN:    In order of problems listed above:  Primary prevention stressed with the patient.  Importance of compliance with diet medication stressed and she vocalized understanding. Paroxysmal atrial fibrillation:I discussed with the patient atrial fibrillation, disease process. Management and therapy including rate and rhythm control, anticoagulation benefits and potential risks were discussed extensively with the patient. Patient had multiple questions which were answered to patient's satisfaction.  I reviewed her left atrial size and it is mildly dilated.  In view of this I would like to try cardioversion.  I will do a Chem-7 and LFTs and if they are fine initiate her on amiodarone 400 mg daily and set her up for cardioversion next week.  She is agreeable.  Benefits and potential risks and procedure explained to her at length and questions were answered to her satisfaction. Essential hypertension: Blood pressure stable and diet was emphasized.  Lifestyle modification urged.  Salt intake issues were discussed. Obesity and mixed dyslipidemia: Diet emphasized.  Lipids reviewed and she promises to do better. She will be seen in follow-up appointment in 2 to 3 weeks after cardioversion.   Medication Adjustments/Labs and Tests Ordered: Current medicines are reviewed at length with the patient today.  Concerns regarding medicines are outlined above.  No orders of the defined types were placed in this encounter.  No orders of the defined types were placed in this encounter.    No chief complaint on file.    History of  Present Illness:    Caroline Dean is a 82 y.o. female.  Patient has past medical history of paroxysmal atrial fibrillation, essential hypertension, mixed dyslipidemia and obesity.  She denies any problems at this time except some shortness of breath on exertion.  She tells me when she exerts she has some shortness of breath.  She tells me that she uses anticoagulation on a daily basis and confirms that in the past month she has not missed a single dose.  No chest pain orthopnea or PND.  At the time of my evaluation, the patient is alert awake oriented and in no distress.  Patient mentions to me that she has quit smoking.  She uses alcohol only socially.  At the time of my evaluation, the patient is alert awake oriented and in no distress.  Past Medical History:  Diagnosis Date   Age-related osteoporosis without current pathological fracture 10/28/2015   AGITATION 08/15/2007   Qualifier: Diagnosis of  By: Roxan Hockey, [place]     Anxiety 10/28/2015   Atrial fibrillation ([place]) 12/13/2019   Calculus of gallbladder without cholecystitis without obstruction 08/28/2020   Cataract    Diverticulosis    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/2007   Qualifier: Diagnosis of  By: Roxan Hockey, [place]     Dyslipidemia 01/24/2015   Dysphagia    Essential hypertension 12/14/2019   Fatty liver    FATTY LIVER DISEASE 05/11/2008   Qualifier: Diagnosis of  By: [name] MD, Lowella Bandy    Gastro-esophageal reflux disease with esophagitis 10/28/2015   Gastroesophageal reflux disease 08/15/2007   Qualifier: Diagnosis  of  By: Roxan Hockey, [place]     GERD (gastroesophageal reflux disease)    High risk medication use 10/28/2015   Hyperlipemia    Hyperplastic colon polyp    Hypertension    Internal hemorrhoids    Iron deficiency anemia, unspecified    Malaise and fatigue 10/28/2015   Mixed hyperlipidemia 10/28/2015   Nephrolithiasis 10/28/2015   Obesity (BMI 30.0-34.9) 12/08/2021   Osteoarthritis     PAF (paroxysmal atrial fibrillation) ([place]) 01/18/2020   Palpitations 01/24/2015   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   Prediabetes 05/17/2019   Primary osteoarthritis involving multiple joints 10/28/2015   S/P Nissen fundoplication (without gastrostomy tube) procedure 07/14/2019   Shortness of breath 01/24/2015   Substance abuse ([place])    Tachycardia    Ulcerative colitis ([place]) 08/15/2007   Qualifier: Diagnosis of  By: Roxan Hockey, [place]     Vitamin B12 deficiency    Vitamin D deficiency 10/28/2015    Past Surgical History:  Procedure Laterality Date   BIOPSY  06/19/2019   Procedure: BIOPSY;  Surgeon: Mauri Pole, MD;  Location: [facility];  Service: Endoscopy;;   COLONOSCOPY  01/23/2021   numerous due to UC--2019- Dr [name], not in epic,  2013, 2010,2007, 2004 and [place] N/A 06/19/2019   Procedure: ESOPHAGEAL MANOMETRY (EM);  Surgeon: Mauri Pole, MD;  Location: [facility];  Service: Endoscopy;  Laterality: N/A;   ESOPHAGOGASTRODUODENOSCOPY (EGD) WITH PROPOFOL N/A 06/19/2019   Procedure: ESOPHAGOGASTRODUODENOSCOPY (EGD) WITH PROPOFOL;  Surgeon: Mauri Pole, MD;  Location: [facility];  Service: Endoscopy;  Laterality: N/A;  with manometry probe placement   HIATAL HERNIA REPAIR     January 2021   TONSILLECTOMY AND ADENOIDECTOMY      Current Medications: Current Meds  Medication Sig   ALPRAZolam (XANAX) 0.25 MG tablet Take 0.25 mg by mouth daily as needed for anxiety.    Cholecalciferol 25 MCG (1000 UT) tablet Take 3,000 Units by mouth daily.    Cyanocobalamin 1000 MCG/ML KIT Inject 1,000 mcg as directed every 30 (thirty) days.   diltiazem (CARDIZEM CD) 120 MG 24 hr capsule Take 1 capsule (120 mg total) by mouth daily.   ELIQUIS 5 MG TABS tablet TAKE 1 TABLET(5 MG) BY MOUTH TWICE DAILY   ferrous sulfate 325 (65 FE) MG tablet Take 325 mg by mouth 3 (three) times a week.    folic  acid (FOLVITE) 1 MG tablet TAKE 1 TABLET BY MOUTH EVERY DAY   losartan (COZAAR) 50 MG tablet Take 1 tablet (50 mg total) by mouth daily.   metoprolol tartrate (LOPRESSOR) 50 MG tablet Take 25 mg by mouth 2 (two) times daily.   omeprazole (PRILOSEC) 20 MG capsule Take 20 mg by mouth daily.   simvastatin (ZOCOR) 20 MG tablet Take 20 mg by mouth daily.    sodium chloride (OCEAN) 0.65 % SOLN nasal spray Place 1 spray into both nostrils as needed for congestion.   sulfaSALAzine (AZULFIDINE) 500 MG tablet TAKE 2 TABLETS BY MOUTH TWICE DAILY   Current Facility-Administered Medications for the 07/02/22 encounter (Office Visit) with Gwendolen Hewlett, Reita Cliche, MD  Medication   0.9 %  sodium chloride infusion     Allergies:   Codeine   Social History   Socioeconomic History   Marital status: Single    Spouse name: Not on file   Number of children: 0   Years of education: Not on file   Highest  education level: Not on file  Occupational History   Occupation: retired  Tobacco Use   Smoking status: Never    Passive exposure: Never   Smokeless tobacco: Never  Vaping Use   Vaping Use: Never used  Substance and Sexual Activity   Alcohol use: Yes    Comment: occasional   Drug use: No   Sexual activity: Not on file  Other Topics Concern   Not on file  Social History Narrative   Not on file   Social Determinants of Health   Financial Resource Strain: Not on file  Food Insecurity: Not on file  Transportation Needs: Not on file  Physical Activity: Not on file  Stress: Not on file  Social Connections: Not on file     Family History: The patient's family history includes Cirrhosis in an other family member; Diabetes in her mother; Heart disease in her father; Lymphoma in her brother. There is no history of Colon cancer.  ROS:   Please see the history of present illness.    All other systems reviewed and are negative.  EKGs/Labs/Other Studies Reviewed:    The following studies were reviewed  today: EKG reveals atrial fibrillation with elevated ventricular rate   Recent Labs: 09/23/2021: Hemoglobin 13.5; Platelets 205.0 12/24/2021: ALT 17; BUN 12; Creatinine, Ser 0.75; Potassium 4.1; Sodium 142  Recent Lipid Panel    Component Value Date/Time   CHOL 142 12/24/2021 0810   TRIG 107 12/24/2021 0810   HDL 56 12/24/2021 0810   CHOLHDL 2.5 12/24/2021 0810   LDLCALC 67 12/24/2021 0810    Physical Exam:    VS:  BP (!) 133/92   Pulse (!) 104   Ht '5\' 7"'$  (1.702 m)   Wt 187 lb 3.2 oz (84.9 kg)   SpO2 96%   BMI 29.32 kg/m     Wt Readings from Last 3 Encounters:  07/02/22 187 lb 3.2 oz (84.9 kg)  12/09/21 189 lb (85.7 kg)  12/08/21 190 lb (86.2 kg)     GEN: Patient is in no acute distress HEENT: Normal NECK: No JVD; No carotid bruits LYMPHATICS: No lymphadenopathy CARDIAC: Hear sounds irregular, 2/6 systolic murmur at the apex. RESPIRATORY:  Clear to auscultation without rales, wheezing or rhonchi  ABDOMEN: Soft, non-tender, non-distended MUSCULOSKELETAL:  No edema; No deformity  SKIN: Warm and dry NEUROLOGIC:  Alert and oriented x 3 PSYCHIATRIC:  Normal affect   Signed, Jenean Lindau, MD  07/02/2022 9:25 AM    [facility] [place]

## 2022-07-02 NOTE — Patient Instructions (Signed)
Medication Instructions:  Your physician has recommended you make the following change in your medication:   Increase your Amiodarone to 400 daily once we review your labs.  *If you need a refill on your cardiac medications before your next appointment, please call your pharmacy*   Lab Work: Your physician recommends that you have a CMET and CBC today in the office.  If you have labs (blood work) drawn today and your tests are completely normal, you will receive your results only by: [place] (if you have MyChart) OR A paper copy in the mail If you have any lab test that is abnormal or we need to change your treatment, we will call you to review the results.   Testing/Procedures: Your physician has recommended that you have a Cardioversion (DCCV). Electrical Cardioversion uses a jolt of electricity to your heart either through paddles or wired patches attached to your chest. This is a controlled, usually prescheduled, procedure. Defibrillation is done under light anesthesia in the hospital, and you usually go home the day of the procedure. This is done to get your heart back into a normal rhythm. You are not awake for the procedure.   Diagnosis: atrial fibrillation  DATE AND TIME OF PROCEDURE: 07/08/22 Time to be Determined  Please register at the Admitting Department at: [facility] will call you the day before with arrival time.  DO NOT EAT OR DRINK ANYTHING after midnight prior to your procedure.  You should take your medications as usual with a sip of water.  If you are diabetic, DO NOT TAKE YOUR DIABETIC MEDICATIONS the morning OF THE PROCEDURE.  If you are taking Lanoxin (also called Digoxin), do NOT take this medication the morning of the procedure.  DO NOT STOP or miss any doses of your  ELIQUIS blood thinners that you are taking.  If you miss a dose of this medication let us know as soon as possible as we will need to reschedule your procedure. Missing a dose of the  blood thinner and continuing with the cardioversion places you at greater risk for having a stroke.  Have your blood drawn as intructed.  You will need someone with you to drive you home after the procedure.    Follow-Up: At [facility], you and your health needs are our priority.  As part of our continuing mission to provide you with exceptional heart care, we have created designated Provider Care Teams.  These Care Teams include your primary Cardiologist (physician) and Advanced Practice Providers (APPs -  Physician Assistants and Nurse Practitioners) who all work together to provide you with the care you need, when you need it.  We recommend signing up for the patient portal called "MyChart".  Sign up information is provided on this After Visit Summary.  MyChart is used to connect with patients for Virtual Visits (Telemedicine).  Patients are able to view lab/test results, encounter notes, upcoming appointments, etc.  Non-urgent messages can be sent to your provider as well.   To learn more about what you can do with MyChart, go to [email or web address].    Your next appointment:   2-3 week(s)  The format for your next appointment:   In Person  Provider:   Jyl Heinz, MD   Other Instructions  Electrical Cardioversion Electrical cardioversion is the delivery of a jolt of electricity to restore a normal rhythm to the heart. A rhythm that is too fast or is not regular keeps the heart from pumping well. In this procedure,  sticky patches or metal paddles are placed on the chest to deliver electricity to the heart from a device. This procedure may be done in an emergency if: There is low or no blood pressure as a result of the heart rhythm. Normal rhythm must be restored as fast as possible to protect the brain and heart from further damage. It may save a life. This may also be a scheduled procedure for irregular or fast heart rhythms that are not immediately  life-threatening. Tell a health care provider about: Any allergies you have. All medicines you are taking, including vitamins, herbs, eye drops, creams, and over-the-counter medicines. Any problems you or family members have had with anesthetic medicines. Any blood disorders you have. Any surgeries you have had. Any medical conditions you have. Whether you are pregnant or may be pregnant. What are the risks? Generally, this is a safe procedure. However, problems may occur, including: Allergic reactions to medicines. A blood clot that breaks free and travels to other parts of your body. The possible return of an abnormal heart rhythm within hours or days after the procedure. Your heart stopping (cardiac arrest). This is rare. What happens before the procedure? Medicines Your health care provider may have you start taking: Blood-thinning medicines (anticoagulants) so your blood does not clot as easily. Medicines to help stabilize your heart rate and rhythm. Ask your health care provider about: Changing or stopping your regular medicines. This is especially important if you are taking diabetes medicines or blood thinners. Taking medicines such as aspirin and ibuprofen. These medicines can thin your blood. Do not take these medicines unless your health care provider tells you to take them. Taking over-the-counter medicines, vitamins, herbs, and supplements. General instructions Follow instructions from your health care provider about eating or drinking restrictions. Plan to have someone take you home from the hospital or clinic. If you will be going home right after the procedure, plan to have someone with you for 24 hours. Ask your health care provider what steps will be taken to help prevent infection. These may include washing your skin with a germ-killing soap. What happens during the procedure? An IV will be inserted into one of your veins. Sticky patches (electrodes) or metal paddles  may be placed on your chest. You will be given a medicine to help you relax (sedative). An electrical shock will be delivered. The procedure may vary among health care providers and hospitals.    What can I expect after the procedure? Your blood pressure, heart rate, breathing rate, and blood oxygen level will be monitored until you leave the hospital or clinic. Your heart rhythm will be watched to make sure it does not change. You may have some redness on the skin where the shocks were given. Follow these instructions at home: Do not drive for 24 hours if you were given a sedative during your procedure. Take over-the-counter and prescription medicines only as told by your health care provider. Ask your health care provider how to check your pulse. Check it often. Rest for 48 hours after the procedure or as told by your health care provider. Avoid or limit your caffeine use as told by your health care provider. Keep all follow-up visits as told by your health care provider. This is important. Contact a health care provider if: You feel like your heart is beating too quickly or your pulse is not regular. You have a serious muscle cramp that does not go away. Get help right away if: You  have discomfort in your chest. You are dizzy or you feel faint. You have trouble breathing or you are short of breath. Your speech is slurred. You have trouble moving an arm or leg on one side of your body. Your fingers or toes turn cold or blue. Summary Electrical cardioversion is the delivery of a jolt of electricity to restore a normal rhythm to the heart. This procedure may be done right away in an emergency or may be a scheduled procedure if the condition is not an emergency. Generally, this is a safe procedure. After the procedure, check your pulse often as told by your health care provider. This information is not intended to replace advice given to you by your health care provider. Make sure you  discuss any questions you have with your health care provider. Document Revised: 01/09/2019 Document Reviewed: 01/09/2019 Elsevier Patient Education  [place].

## 2022-07-03 ENCOUNTER — Telehealth: Payer: Self-pay

## 2022-07-03 LAB — COMPREHENSIVE METABOLIC PANEL
ALT: 20 IU/L (ref 0–32)
AST: 16 IU/L (ref 0–40)
Albumin/Globulin Ratio: 1.7 (ref 1.2–2.2)
Albumin: 4.1 g/dL (ref 3.8–4.8)
Alkaline Phosphatase: 76 IU/L (ref 44–121)
BUN/Creatinine Ratio: 15 (ref 12–28)
BUN: 15 mg/dL (ref 8–27)
Bilirubin Total: 0.7 mg/dL (ref 0.0–1.2)
CO2: 23 mmol/L (ref 20–29)
Calcium: 9.2 mg/dL (ref 8.7–10.3)
Chloride: 106 mmol/L (ref 96–106)
Creatinine, Ser: 1.02 mg/dL — ABNORMAL HIGH (ref 0.57–1.00)
Globulin, Total: 2.4 g/dL (ref 1.5–4.5)
Glucose: 95 mg/dL (ref 70–99)
Potassium: 4.3 mmol/L (ref 3.5–5.2)
Sodium: 144 mmol/L (ref 134–144)
Total Protein: 6.5 g/dL (ref 6.0–8.5)
eGFR: 56 mL/min/{1.73_m2} — ABNORMAL LOW (ref >59)

## 2022-07-03 LAB — CBC
Hematocrit: 38.1 % (ref 34.0–46.6)
Hemoglobin: 12.7 g/dL (ref 11.1–15.9)
MCH: 34.5 pg — ABNORMAL HIGH (ref 26.6–33.0)
MCHC: 33.3 g/dL (ref 31.5–35.7)
MCV: 104 fL — ABNORMAL HIGH (ref 79–97)
Platelets: 228 10*3/uL (ref 150–450)
RBC: 3.68 x10E6/uL — ABNORMAL LOW (ref 3.77–5.28)
RDW: 12.9 % (ref 11.7–15.4)
WBC: 4.8 10*3/uL (ref 3.4–10.8)

## 2022-07-03 MED ORDER — AMIODARONE HCL 200 MG PO TABS: 400.0000 mg | ORAL_TABLET | Freq: Every day | ORAL | 3 refills | Status: DC

## 2022-07-03 NOTE — Telephone Encounter (Signed)
Expand All Collapse All  Cardiology Office Note:     Date:  07/02/2022    ID:  Caroline Dean, DOB 1940/11/27, MRN [id number]   PCP:  Mayer Camel, NP       Cardiologist:  Jenean Lindau, MD    Referring MD: Bess Harvest*      ASSESSMENT:     1. PAF (paroxysmal atrial fibrillation) ([place])   2. Essential hypertension   3. Obesity (BMI 30.0-34.9)   4. Mixed hyperlipidemia     PLAN:     In order of problems listed above:   Primary prevention stressed with the patient.  Importance of compliance with diet medication stressed and she vocalized understanding. Paroxysmal atrial fibrillation:I discussed with the patient atrial fibrillation, disease process. Management and therapy including rate and rhythm control, anticoagulation benefits and potential risks were discussed extensively with the patient. Patient had multiple questions which were answered to patient's satisfaction.  I reviewed her left atrial size and it is mildly dilated.  In view of this I would like to try cardioversion.  I will do a Chem-7 and LFTs and if they are fine initiate her on amiodarone 400 mg daily and set her up for cardioversion next week.  She is agreeable.  Benefits and potential risks and procedure explained to her at length and questions were answered to her satisfaction. Essential hypertension: Blood pressure stable and diet was emphasized.  Lifestyle modification urged.  Salt intake issues were discussed. Obesity and mixed dyslipidemia: Diet emphasized.  Lipids reviewed and she promises to do better. She will be seen in follow-up appointment in 2 to 3 weeks after cardioversion.      Prescription sent in for Amiodarone 200 mg Take 400 mg daily per Dr. Julien Nordmann office note on 07/02/22.

## 2022-07-03 NOTE — Telephone Encounter (Signed)
-----  Message from Jenean Lindau, MD sent at 07/03/2022  8:12 AM EST ----- The results of the study is unremarkable. Please inform patient. I will discuss in detail at next appointment. Cc  primary care/referring physician Jenean Lindau, MD 07/03/2022 8:12 AM

## 2022-07-08 ENCOUNTER — Other Ambulatory Visit: Payer: Self-pay | Admitting: Cardiology

## 2022-07-08 DIAGNOSIS — I499 Cardiac arrhythmia, unspecified: Secondary | ICD-10-CM | POA: Diagnosis not present

## 2022-07-08 DIAGNOSIS — R001 Bradycardia, unspecified: Secondary | ICD-10-CM | POA: Diagnosis not present

## 2022-07-08 DIAGNOSIS — I4891 Unspecified atrial fibrillation: Secondary | ICD-10-CM | POA: Diagnosis not present

## 2022-07-08 NOTE — Telephone Encounter (Signed)
Prescription refill request for Eliquis received. Indication: PAF Last office visit: 07/02/22  R Revankar MD Scr: 1.02 on 07/02/22 Age: 82 Weight: 84.9kg  Based on above findings Eliquis '5mg'$  twice daily is the appropriate dose.  Refill approved.

## 2022-07-15 ENCOUNTER — Encounter: Payer: Self-pay | Admitting: Cardiology

## 2022-07-24 ENCOUNTER — Encounter: Payer: Self-pay | Admitting: Cardiology

## 2022-07-24 ENCOUNTER — Ambulatory Visit: Payer: Medicare Other | Attending: Cardiology | Admitting: Cardiology

## 2022-07-24 ENCOUNTER — Telehealth: Payer: Self-pay | Admitting: *Deleted

## 2022-07-24 VITALS — BP 132/72 | HR 64 | Ht 66.0 in | Wt 189.0 lb

## 2022-07-24 DIAGNOSIS — I480 Paroxysmal atrial fibrillation: Secondary | ICD-10-CM | POA: Diagnosis not present

## 2022-07-24 DIAGNOSIS — I10 Essential (primary) hypertension: Secondary | ICD-10-CM | POA: Diagnosis not present

## 2022-07-24 DIAGNOSIS — E782 Mixed hyperlipidemia: Secondary | ICD-10-CM | POA: Diagnosis not present

## 2022-07-24 MED ORDER — AMIODARONE HCL 200 MG PO TABS: 200.0000 mg | ORAL_TABLET | Freq: Every day | ORAL | 3 refills | Status: DC

## 2022-07-24 NOTE — Addendum Note (Signed)
Addended by: Jerl Santos R on: 07/24/2022 02:06 PM   Modules accepted: Orders

## 2022-07-24 NOTE — Telephone Encounter (Signed)
Left detailed message on home machine per DPR for pt to decrease Amiodarone to once daily in 1 week.

## 2022-07-24 NOTE — Patient Instructions (Signed)
Medication Instructions:  Your physician has recommended you make the following change in your medication:  Reduce Amiodarone to 1 tablet daily   *If you need a refill on your cardiac medications before your next appointment, please call your pharmacy*   Lab Work: Your physician recommends that you return for lab work in: Today for BMP and Liver Function  If you have labs (blood work) drawn today and your tests are completely normal, you will receive your results only by: MyChart Message (if you have MyChart) OR A paper copy in the mail If you have any lab test that is abnormal or we need to change your treatment, we will call you to review the results.   Testing/Procedures: NONE   Follow-Up: At [facility], you and your health needs are our priority.  As part of our continuing mission to provide you with exceptional heart care, we have created designated Provider Care Teams.  These Care Teams include your primary Cardiologist (physician) and Advanced Practice Providers (APPs -  Physician Assistants and Nurse Practitioners) who all work together to provide you with the care you need, when you need it.  We recommend signing up for the patient portal called "MyChart".  Sign up information is provided on this After Visit Summary.  MyChart is used to connect with patients for Virtual Visits (Telemedicine).  Patients are able to view lab/test results, encounter notes, upcoming appointments, etc.  Non-urgent messages can be sent to your provider as well.   To learn more about what you can do with MyChart, go to [email or web address].    Your next appointment:   6 month(s)  Provider:   Jyl Heinz, MD    Other Instructions

## 2022-07-24 NOTE — Progress Notes (Addendum)
Cardiology Office Note:    Date:  07/24/2022   ID:  Caroline Dean, DOB 22-Dec-1940, MRN [id number]  PCP:  Mayer Camel, NP  Cardiologist:  Jenean Lindau, MD   Referring MD: Bess Harvest*    ASSESSMENT:    1. PAF (paroxysmal atrial fibrillation) ([place])   2. Essential hypertension   3. Mixed hyperlipidemia    PLAN:    In order of problems listed above:  Primary prevention stressed with patient.  Importance of compliance with diet medication stressed and she vocalized understanding. Paroxysmal atrial fibrillation:I discussed with the patient atrial fibrillation, disease process. Management and therapy including rate and rhythm control, anticoagulation benefits and potential risks were discussed extensively with the patient. Patient had multiple questions which were answered to patient's satisfaction. Amiodarone therapy: Patient is taking 200 mg of amiodarone daily.  Will do a Chem-7 and LFTs today.  We will also cut down her dose in the next week.  Then she will take 100 mg daily. Essential hypertension: Blood pressure stable and diet was emphasized lifestyle modification urged. Obesity: Weight reduction stressed and she plans to do better.  Risks of obesity explained. Mixed dyslipidemia: On lipid-lowering medications followed by primary care. Patient will be seen in follow-up appointment in 6 months or earlier if the patient has any concerns.  Leighton Ruff, CMA chaperoned the entire visit.    Medication Adjustments/Labs and Tests Ordered: Current medicines are reviewed at length with the patient today.  Concerns regarding medicines are outlined above.  No orders of the defined types were placed in this encounter.  No orders of the defined types were placed in this encounter.    No chief complaint on file.    History of Present Illness:    Caroline Dean is a 82 y.o. female.  Patient has past medical history of essential hypertension, mixed  dyslipidemia and paroxysmal atrial fibrillation.  She denies any problems at this time and takes care of activities of daily living.  No chest pain orthopnea or PND.  She underwent stress cardioversion and subsequently feels much better.  Past Medical History:  Diagnosis Date   Age-related osteoporosis without current pathological fracture 10/28/2015   AGITATION 08/15/2007   Qualifier: Diagnosis of  By: Roxan Hockey, [place]     Anxiety 10/28/2015   Atrial fibrillation ([place]) 12/13/2019   Calculus of gallbladder without cholecystitis without obstruction 08/28/2020   Cataract    Diverticulosis    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/2007   Qualifier: Diagnosis of  By: Roxan Hockey, [place]     Dyslipidemia 01/24/2015   Dysphagia    Essential hypertension 12/14/2019   Fatty liver    FATTY LIVER DISEASE 05/11/2008   Qualifier: Diagnosis of  By: [name] MD, Lowella Bandy    Gastro-esophageal reflux disease with esophagitis 10/28/2015   Gastroesophageal reflux disease 08/15/2007   Qualifier: Diagnosis of  By: Roxan Hockey, [place]     GERD (gastroesophageal reflux disease)    High risk medication use 10/28/2015   Hyperlipemia    Hyperplastic colon polyp    Hypertension    Internal hemorrhoids    Iron deficiency anemia, unspecified    Malaise and fatigue 10/28/2015   Mixed hyperlipidemia 10/28/2015   Nephrolithiasis 10/28/2015   Obesity (BMI 30.0-34.9) 12/08/2021   Osteoarthritis    PAF (paroxysmal atrial fibrillation) ([place]) 01/18/2020   Palpitations 01/24/2015   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   Prediabetes 05/17/2019   Primary osteoarthritis involving multiple joints 10/28/2015   S/P Nissen  fundoplication (without gastrostomy tube) procedure 07/14/2019   Shortness of breath 01/24/2015   Substance abuse ([place])    Tachycardia    Ulcerative colitis ([place]) 08/15/2007   Qualifier: Diagnosis of  By: Roxan Hockey, [place]     Vitamin B12 deficiency    Vitamin D deficiency  10/28/2015    Past Surgical History:  Procedure Laterality Date   BIOPSY  06/19/2019   Procedure: BIOPSY;  Surgeon: Mauri Pole, MD;  Location: [facility];  Service: Endoscopy;;   COLONOSCOPY  01/23/2021   numerous due to UC--2019- Dr [name], not in epic,  2013, 2010,2007, 2004 and [place] N/A 06/19/2019   Procedure: ESOPHAGEAL MANOMETRY (EM);  Surgeon: Mauri Pole, MD;  Location: [facility];  Service: Endoscopy;  Laterality: N/A;   ESOPHAGOGASTRODUODENOSCOPY (EGD) WITH PROPOFOL N/A 06/19/2019   Procedure: ESOPHAGOGASTRODUODENOSCOPY (EGD) WITH PROPOFOL;  Surgeon: Mauri Pole, MD;  Location: [facility];  Service: Endoscopy;  Laterality: N/A;  with manometry probe placement   HIATAL HERNIA REPAIR     January 2021   TONSILLECTOMY AND ADENOIDECTOMY      Current Medications: Current Meds  Medication Sig   ALPRAZolam (XANAX) 0.25 MG tablet Take 0.25 mg by mouth daily as needed for anxiety.    amiodarone (PACERONE) 200 MG tablet Take 2 tablets (400 mg total) by mouth daily.   Cholecalciferol 25 MCG (1000 UT) tablet Take 3,000 Units by mouth daily.    Cyanocobalamin 1000 MCG/ML KIT Inject 1,000 mcg as directed every 30 (thirty) days.   diltiazem (CARDIZEM CD) 120 MG 24 hr capsule Take 1 capsule (120 mg total) by mouth daily.   ELIQUIS 5 MG TABS tablet TAKE 1 TABLET(5 MG) BY MOUTH TWICE DAILY   ferrous sulfate 325 (65 FE) MG tablet Take 325 mg by mouth 3 (three) times a week.    folic acid (FOLVITE) 1 MG tablet TAKE 1 TABLET BY MOUTH EVERY DAY   losartan (COZAAR) 50 MG tablet Take 1 tablet (50 mg total) by mouth daily.   metoprolol tartrate (LOPRESSOR) 50 MG tablet Take 25 mg by mouth 2 (two) times daily.   omeprazole (PRILOSEC) 20 MG capsule Take 20 mg by mouth daily.   simvastatin (ZOCOR) 20 MG tablet Take 20 mg by mouth daily.    sodium chloride (OCEAN) 0.65 % SOLN nasal spray Place 1 spray into both  nostrils as needed for congestion.   sulfaSALAzine (AZULFIDINE) 500 MG tablet TAKE 2 TABLETS BY MOUTH TWICE DAILY   Current Facility-Administered Medications for the 07/24/22 encounter (Office Visit) with Alaynah Schutter, Reita Cliche, MD  Medication   0.9 %  sodium chloride infusion     Allergies:   Codeine   Social History   Socioeconomic History   Marital status: Single    Spouse name: Not on file   Number of children: 0   Years of education: Not on file   Highest education level: Not on file  Occupational History   Occupation: retired  Tobacco Use   Smoking status: Never    Passive exposure: Never   Smokeless tobacco: Never  Vaping Use   Vaping Use: Never used  Substance and Sexual Activity   Alcohol use: Yes    Comment: occasional   Drug use: No   Sexual activity: Not on file  Other Topics Concern   Not on file  Social History Narrative   Not on file   Social Determinants of Health  Financial Resource Strain: Not on file  Food Insecurity: Not on file  Transportation Needs: Not on file  Physical Activity: Not on file  Stress: Not on file  Social Connections: Not on file     Family History: The patient's family history includes Cirrhosis in an other family member; Diabetes in her mother; Heart disease in her father; Lymphoma in her brother. There is no history of Colon cancer.  ROS:   Please see the history of present illness.    All other systems reviewed and are negative.  EKGs/Labs/Other Studies Reviewed:    The following studies were reviewed today: EKG reveals sinus rhythm with nonspecific ST-T changes   Recent Labs: 07/02/2022: ALT 20; BUN 15; Creatinine, Ser 1.02; Hemoglobin 12.7; Platelets 228; Potassium 4.3; Sodium 144  Recent Lipid Panel    Component Value Date/Time   CHOL 142 12/24/2021 0810   TRIG 107 12/24/2021 0810   HDL 56 12/24/2021 0810   CHOLHDL 2.5 12/24/2021 0810   LDLCALC 67 12/24/2021 0810    Physical Exam:    VS:  BP 132/72   Pulse  64   Ht '5\' 6"'$  (1.676 m)   Wt 189 lb (85.7 kg)   SpO2 95%   BMI 30.51 kg/m     Wt Readings from Last 3 Encounters:  07/24/22 189 lb (85.7 kg)  07/02/22 187 lb 3.2 oz (84.9 kg)  12/09/21 189 lb (85.7 kg)     GEN: Patient is in no acute distress HEENT: Normal NECK: No JVD; No carotid bruits LYMPHATICS: No lymphadenopathy CARDIAC: Hear sounds regular, 2/6 systolic murmur at the apex. RESPIRATORY:  Clear to auscultation without rales, wheezing or rhonchi  ABDOMEN: Soft, non-tender, non-distended MUSCULOSKELETAL:  No edema; No deformity  SKIN: Warm and dry NEUROLOGIC:  Alert and oriented x 3 PSYCHIATRIC:  Normal affect   Signed, Jenean Lindau, MD  07/24/2022 1:18 PM    [facility]

## 2022-07-25 LAB — HEPATIC FUNCTION PANEL
ALT: 24 IU/L (ref 0–32)
AST: 21 IU/L (ref 0–40)
Albumin: 4.2 g/dL (ref 3.8–4.8)
Alkaline Phosphatase: 67 IU/L (ref 44–121)
Bilirubin Total: 0.5 mg/dL (ref 0.0–1.2)
Bilirubin, Direct: 0.2 mg/dL (ref 0.00–0.40)
Total Protein: 6.4 g/dL (ref 6.0–8.5)

## 2022-07-25 LAB — BASIC METABOLIC PANEL
BUN/Creatinine Ratio: 22 (ref 12–28)
BUN: 21 mg/dL (ref 8–27)
CO2: 20 mmol/L (ref 20–29)
Calcium: 8.5 mg/dL — ABNORMAL LOW (ref 8.7–10.3)
Chloride: 106 mmol/L (ref 96–106)
Creatinine, Ser: 0.94 mg/dL (ref 0.57–1.00)
Glucose: 171 mg/dL — ABNORMAL HIGH (ref 70–99)
Potassium: 4.4 mmol/L (ref 3.5–5.2)
Sodium: 141 mmol/L (ref 134–144)
eGFR: 61 mL/min/{1.73_m2} (ref >59)

## 2022-09-09 ENCOUNTER — Other Ambulatory Visit: Payer: Self-pay

## 2022-09-09 MED ORDER — DILTIAZEM HCL ER COATED BEADS 120 MG PO CP24: 120.0000 mg | ORAL_CAPSULE | Freq: Every day | ORAL | 2 refills | Status: DC

## 2022-11-15 ENCOUNTER — Other Ambulatory Visit: Payer: Self-pay | Admitting: Gastroenterology

## 2022-11-17 NOTE — Telephone Encounter (Signed)
Keep appointment with Caroline Dean

## 2022-11-28 ENCOUNTER — Other Ambulatory Visit: Payer: Self-pay | Admitting: Cardiology

## 2022-11-28 DIAGNOSIS — I10 Essential (primary) hypertension: Secondary | ICD-10-CM

## 2022-12-16 ENCOUNTER — Ambulatory Visit: Payer: Medicare Other | Admitting: Physician Assistant

## 2022-12-16 ENCOUNTER — Encounter: Payer: Self-pay | Admitting: Physician Assistant

## 2022-12-16 ENCOUNTER — Other Ambulatory Visit (INDEPENDENT_AMBULATORY_CARE_PROVIDER_SITE_OTHER): Payer: Medicare Other

## 2022-12-16 VITALS — BP 142/84 | HR 72 | Ht 65.5 in | Wt 199.0 lb

## 2022-12-16 DIAGNOSIS — K51919 Ulcerative colitis, unspecified with unspecified complications: Secondary | ICD-10-CM | POA: Diagnosis not present

## 2022-12-16 DIAGNOSIS — K5180 Other ulcerative colitis without complications: Secondary | ICD-10-CM | POA: Diagnosis not present

## 2022-12-16 LAB — VITAMIN B12: Vitamin B-12: 627 pg/mL (ref 211–911)

## 2022-12-16 LAB — C-REACTIVE PROTEIN: CRP: <1 mg/dL (ref 0.5–20.0)

## 2022-12-16 LAB — FOLATE: Folate: >23.8 ng/mL (ref >5.9)

## 2022-12-16 NOTE — Patient Instructions (Signed)
_______________________________________________________  If your blood pressure at your visit was 140/90 or greater, please contact your primary care physician to follow up on this.  _______________________________________________________  If you are age 81 or older, your body mass index should be between 23-30. Your Body mass index is 32.61 kg/m. If this is out of the aforementioned range listed, please consider follow up with your Primary Care Provider.  If you are age 88 or younger, your body mass index should be between 19-25. Your Body mass index is 32.61 kg/m. If this is out of the aformentioned range listed, please consider follow up with your Primary Care Provider.   ________________________________________________________  The [facility] providers would like to encourage you to use [facility] to communicate with providers for non-urgent requests or questions.  Due to long hold times on the telephone, sending your provider a message by [facility] may be a faster and more efficient way to get a response.  Please allow 48 business hours for a response.  Please remember that this is for non-urgent requests.  _______________________________________________________  Your provider has requested that you go to the basement level for lab work before leaving today. Press "B" on the elevator. The lab is located at the first door on the left as you exit the elevator.  Please follow up in 12 months. Give [place] a call at [phone] to schedule an appointment.  It was a pleasure to see you today!  Thank you for trusting me with your gastrointestinal care!

## 2022-12-16 NOTE — Progress Notes (Signed)
Chief Complaint: Follow up Ulcerative proctosigmoiditis  HPI:    Caroline Dean is a 82 year old female with a past medical history as listed below including large hiatal hernia status post Nissen fundoplication, anxiety, fatty liver disease, GERD, diverticulosis and ulcerative colitis, known to Dr. Lavon Paganini, who presents to clinic today for follow-up of her ulcerative colitis.      01/23/2021 colonoscopy with three 8-14 mm polyps, decreased mucosal vascular pattern in the entire examined colon, pseudopolyps in the colon and diverticulosis as well as nonbleeding external and internal hemorrhoids.    12/09/2021 patient seen in clinic by Dr. Lavon Paganini at that time doing well.  Intermittent episodes of excessive gas and diarrhea once every 2 weeks.  She had tapered off of 6-MP and was taking Sulfasalazine.  At that time continued on Sulfasalazine along with Folic acid.  Recommended CRP fecal calprotectin, B12 and folate/iron.  Patient was tapered off PPI and transition to Pepcid twice a day.    12/11/2021 fecal calprotectin minimally elevated at 132.  B12 and iron normal.    05/26/2022 iron panel normal.  CBC normal.  CMP normal.    Today, the patient tells me that she is doing very well on her Sulfasalazine 500 mg, 2 tabs twice a day and Align which she takes daily.  Tells me when she was seen about a year ago she was having some issues with gas and bloating but since starting the Align, everything has "leveled off".  She has no acute complaints or concerns.  She is very active.    Denies fever, chills, weight loss, blood in her stool, nausea, vomiting or diarrhea.  Past Medical History:  Diagnosis Date   Age-related osteoporosis without current pathological fracture 10/28/2015   AGITATION 08/15/2007   Qualifier: Diagnosis of  By: Clydia Llano, [place]     Anxiety 10/28/2015   Atrial fibrillation ([facility]) 12/13/2019   Calculus of gallbladder without cholecystitis without obstruction 08/28/2020    Cataract    Diverticulosis    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/2007   Qualifier: Diagnosis of  By: Clydia Llano, [place]     Dyslipidemia 01/24/2015   Dysphagia    Essential hypertension 12/14/2019   Fatty liver    FATTY LIVER DISEASE 05/11/2008   Qualifier: Diagnosis of  By: Juanda Chance MD, Hedwig Morton    Gastro-esophageal reflux disease with esophagitis 10/28/2015   Gastroesophageal reflux disease 08/15/2007   Qualifier: Diagnosis of  By: Clydia Llano, Norchel     GERD (gastroesophageal reflux disease)    High risk medication use 10/28/2015   Hyperlipemia    Hyperplastic colon polyp    Hypertension    Internal hemorrhoids    Iron deficiency anemia, unspecified    Malaise and fatigue 10/28/2015   Mixed hyperlipidemia 10/28/2015   Nephrolithiasis 10/28/2015   Obesity (BMI 30.0-34.9) 12/08/2021   Osteoarthritis    PAF (paroxysmal atrial fibrillation) ([facility]) 01/18/2020   Palpitations 01/24/2015   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   Prediabetes 05/17/2019   Primary osteoarthritis involving multiple joints 10/28/2015   S/P Nissen fundoplication (without gastrostomy tube) procedure 07/14/2019   Shortness of breath 01/24/2015   Substance abuse ([facility])    Tachycardia    Ulcerative colitis ([facility]) 08/15/2007   Qualifier: Diagnosis of  By: Clydia Llano, Norchel     Vitamin B12 deficiency    Vitamin D deficiency 10/28/2015    Past Surgical History:  Procedure Laterality Date   BIOPSY  06/19/2019   Procedure: BIOPSY;  Surgeon: Napoleon Form, MD;  [facility]: [facility];  Service: Endoscopy;;   COLONOSCOPY  01/23/2021   numerous due to UC--2019- Dr Charm Barges, not in epic,  2013, 2010,2007, 2004 and [place]   DILATION AND CURETTAGE OF UTERUS     ESOPHAGEAL MANOMETRY N/A 06/19/2019   Procedure: ESOPHAGEAL MANOMETRY (EM);  Surgeon: Napoleon Form, MD;  Location: [facility];  Service: Endoscopy;  Laterality: N/A;   ESOPHAGOGASTRODUODENOSCOPY (EGD) WITH PROPOFOL N/A 06/19/2019    Procedure: ESOPHAGOGASTRODUODENOSCOPY (EGD) WITH PROPOFOL;  Surgeon: Napoleon Form, MD;  [facility];  Service: Endoscopy;  Laterality: N/A;  with manometry probe placement   HIATAL HERNIA REPAIR     January 2021   TONSILLECTOMY AND ADENOIDECTOMY      Current Outpatient Medications  Medication Sig Dispense Refill   ALPRAZolam (XANAX) 0.25 MG tablet Take 0.25 mg by mouth daily as needed for anxiety.      amiodarone (PACERONE) 200 MG tablet Take 1 tablet (200 mg total) by mouth daily. 90 tablet 3   Cholecalciferol 25 MCG (1000 UT) tablet Take 3,000 Units by mouth daily.      Cyanocobalamin 1000 MCG/ML KIT Inject 1,000 mcg as directed every 30 (thirty) days.     diltiazem (CARDIZEM CD) 120 MG 24 hr capsule Take 1 capsule (120 mg total) by mouth daily. 90 capsule 2   ELIQUIS 5 MG TABS tablet TAKE 1 TABLET(5 MG) BY MOUTH TWICE DAILY 180 tablet 1   ferrous sulfate 325 (65 FE) MG tablet Take 325 mg by mouth 3 (three) times a week.      folic acid (FOLVITE) 1 MG tablet TAKE 1 TABLET BY MOUTH EVERY DAY 100 tablet 3   losartan (COZAAR) 50 MG tablet Take 1 tablet (50 mg total) by mouth daily. 90 tablet 1   metoprolol tartrate (LOPRESSOR) 50 MG tablet Take 25 mg by mouth 2 (two) times daily.     omeprazole (PRILOSEC) 20 MG capsule Take 20 mg by mouth daily.     simvastatin (ZOCOR) 20 MG tablet Take 20 mg by mouth daily.      sodium chloride (OCEAN) 0.65 % SOLN nasal spray Place 1 spray into both nostrils as needed for congestion.     sulfaSALAzine (AZULFIDINE) 500 MG tablet TAKE 2 TABLETS BY MOUTH TWICE DAILY 120 tablet 12   Current Facility-Administered Medications  Medication Dose Route Frequency Provider Last Rate Last Admin   0.9 %  sodium chloride infusion  500 mL Intravenous Continuous Nandigam, Eleonore Chiquito, MD        Allergies as of 12/16/2022 - Review Complete 12/16/2022  Allergen Reaction Noted   Codeine Nausea And Vomiting 05/10/2008    Family History  Problem  Relation Age of Onset   Lymphoma Brother    Diabetes Mother    Heart disease Father    Cirrhosis Other        neice with liver transplant   Colon cancer Neg Hx     Social History   Socioeconomic History   Marital status: Single    Spouse name: Not on file   Number of children: 0   Years of education: Not on file   Highest education level: Not on file  Occupational History   Occupation: retired  Tobacco Use   Smoking status: Never    Passive exposure: Never   Smokeless tobacco: Never  Vaping Use   Vaping Use: Never used  Substance and Sexual Activity   Alcohol use: Yes    Comment: occasional   Drug use: No  Sexual activity: Not on file  Other Topics Concern   Not on file  Social History Narrative   Not on file   Social Determinants of Health   Financial Resource Strain: Not on file  Food Insecurity: Not on file  Transportation Needs: Not on file  Physical Activity: Not on file  Stress: Not on file  Social Connections: Not on file  Intimate Partner Violence: Not on file    Review of Systems:    Constitutional: No weight loss, fever or chills Cardiovascular: No chest pain Respiratory: No SOB  Gastrointestinal: See HPI and otherwise negative   Physical Exam:  Vital signs: BP (!) 142/84 (BP Location: Left Arm, Patient Position: Sitting, Cuff Size: Large)   Pulse 72 Comment: irregular  Ht 5' 5.5" (1.664 m)   Wt 199 lb (90.3 kg)   BMI 32.61 kg/m    Constitutional:   Pleasant Elderly Caucasian female appears to be in NAD, Well developed, Well nourished, alert and cooperative Respiratory: Respirations even and unlabored. Lungs clear to auscultation bilaterally.   No wheezes, crackles, or rhonchi.  Cardiovascular: Normal S1, S2. No MRG. Regular rate and rhythm. 1+ peripheral edema to level of the shin Gastrointestinal:  Soft, nondistended, nontender. No rebound or guarding. Normal bowel sounds. No appreciable masses or hepatomegaly. Rectal:  Not performed.   Psychiatric: Oriented to person, place and time. Demonstrates good judgement and reason without abnormal affect or behaviors.  RELEVANT LABS AND IMAGING: CBC    Component Value Date/Time   WBC 4.8 07/02/2022 0950   WBC 5.7 09/23/2021 0902   RBC 3.68 (L) 07/02/2022 0950   RBC 4.09 09/23/2021 0902   HGB 12.7 07/02/2022 0950   HCT 38.1 07/02/2022 0950   PLT 228 07/02/2022 0950   MCV 104 (H) 07/02/2022 0950   MCH 34.5 (H) 07/02/2022 0950   MCH 32.4 07/11/2019 1326   MCHC 33.3 07/02/2022 0950   MCHC 32.8 09/23/2021 0902   RDW 12.9 07/02/2022 0950   LYMPHSABS 1.0 09/23/2021 0902   MONOABS 0.5 09/23/2021 0902   EOSABS 0.1 09/23/2021 0902   BASOSABS 0.1 09/23/2021 0902    CMP     Component Value Date/Time   NA 141 07/24/2022 1414   K 4.4 07/24/2022 1414   CL 106 07/24/2022 1414   CO2 20 07/24/2022 1414   GLUCOSE 171 (H) 07/24/2022 1414   GLUCOSE 99 09/23/2021 0902   BUN 21 07/24/2022 1414   CREATININE 0.94 07/24/2022 1414   CALCIUM 8.5 (L) 07/24/2022 1414   PROT 6.4 07/24/2022 1414   ALBUMIN 4.2 07/24/2022 1414   AST 21 07/24/2022 1414   ALT 24 07/24/2022 1414   ALKPHOS 67 07/24/2022 1414   BILITOT 0.5 07/24/2022 1414   GFRNONAA >60 07/15/2019 0622   GFRAA >60 07/15/2019 0622    Assessment: 1.  Ulcerative colitis: Controlled on Sulfasalazine 500 mg 2 tabs twice daily, doing very well  Plan: 1.  Ordered labs to include a CRP, B12/folate.  Also ordered a fecal calprotectin. 2.  Continue Sulfasalazine 500 mg 2 tabs twice daily.  Patient does not think she needs refills now but would be good for a year from now. 3.  Reviewed recent iron studies and CBC/CMP done in December/January timeframe, things are stable. 4.  Patient to follow in clinic in a year or sooner if needed.  Caroline Meeker, PA-C [facility] 12/16/2022, 9:45 AM  Cc: Brown-Patram, Melissa J*

## 2022-12-25 LAB — CALPROTECTIN, FECAL: Calprotectin, Fecal: 84 ug/g (ref 0–120)

## 2022-12-29 ENCOUNTER — Other Ambulatory Visit: Payer: Self-pay | Admitting: Cardiology

## 2022-12-29 NOTE — Progress Notes (Signed)
Reviewed and agree with documentation and assessment and plan. K. Veena Amaria Mundorf , MD   

## 2023-01-04 ENCOUNTER — Other Ambulatory Visit: Payer: Self-pay | Admitting: Cardiology

## 2023-01-04 DIAGNOSIS — I480 Paroxysmal atrial fibrillation: Secondary | ICD-10-CM

## 2023-01-04 NOTE — Telephone Encounter (Signed)
Prescription refill request for Eliquis received. Indication: Afib  Last office visit: 07/24/22 (Revankar)  Scr: 0.94 (07/24/22)  Age: 82 Weight: 90.3kg  Appropriate dose. Refill sent.

## 2023-02-04 ENCOUNTER — Ambulatory Visit: Payer: Medicare Other | Attending: Cardiology | Admitting: Cardiology

## 2023-02-04 ENCOUNTER — Encounter: Payer: Self-pay | Admitting: Cardiology

## 2023-02-04 VITALS — BP 160/100 | HR 82 | Ht 66.0 in | Wt 198.6 lb

## 2023-02-04 DIAGNOSIS — Z79899 Other long term (current) drug therapy: Secondary | ICD-10-CM

## 2023-02-04 DIAGNOSIS — E782 Mixed hyperlipidemia: Secondary | ICD-10-CM | POA: Diagnosis not present

## 2023-02-04 DIAGNOSIS — I10 Essential (primary) hypertension: Secondary | ICD-10-CM | POA: Diagnosis not present

## 2023-02-04 DIAGNOSIS — I4819 Other persistent atrial fibrillation: Secondary | ICD-10-CM | POA: Diagnosis not present

## 2023-02-04 DIAGNOSIS — I480 Paroxysmal atrial fibrillation: Secondary | ICD-10-CM

## 2023-02-04 HISTORY — DX: Other long term (current) drug therapy: Z79.899

## 2023-02-04 MED ORDER — METOPROLOL SUCCINATE ER 50 MG PO TB24: 50.0000 mg | ORAL_TABLET | Freq: Every day | ORAL | 3 refills | Status: DC

## 2023-02-04 MED ORDER — DILTIAZEM HCL ER COATED BEADS 240 MG PO CP24: 240.0000 mg | ORAL_CAPSULE | Freq: Every day | ORAL | 3 refills | Status: DC

## 2023-02-04 NOTE — Progress Notes (Signed)
Cardiology Office Note:    Date:  02/04/2023   ID:  Caroline Dean, DOB [birth year], MRN [id number]  PCP:  Krystal Clark, NP  Cardiologist:  Garwin Brothers, MD   Referring MD: Rhea Bleacher*    ASSESSMENT:    1. PAF (paroxysmal atrial fibrillation) (HCC)   2. Essential hypertension   3. Persistent atrial fibrillation (HCC)   4. Mixed hyperlipidemia   5. On amiodarone therapy    PLAN:    In order of problems listed above:  Primary prevention stressed with the patient.  Importance of compliance with diet medication stressed and patient verbalized standing. Persistent atrial fibrillation:I discussed with the patient atrial fibrillation, disease process. Management and therapy including rate and rhythm control, anticoagulation benefits and potential risks were discussed extensively with the patient. Patient had multiple questions which were answered to patient's satisfaction. Amiodarone therapy: Apparently now it is obvious that this has not worked on this patient.  She is doing well overall.  She is in atrial fibrillation and handles it well.  For this reason I discussed benefits risks of amiodarone and we will discontinue it.  She agrees. Uncontrolled hypertension: Blood pressure is significantly elevated.  Will recheck this.  She has similar numbers at home.  We will double her calcium channel blocker.  She will keep a track of her pulse blood pressure at home and get it back to [place] in a week.  Salt intake and diet emphasized.  I will change her beta-blocker to once a day medicine. Mixed dyslipidemia: On lipid-lowering medications followed by primary care.  Diet emphasized.  Weight reduction stressed and she promises to do better. Patient will be seen in follow-up appointment in 4 months or earlier if the patient has any concerns.    Medication Adjustments/Labs and Tests Ordered: Current medicines are reviewed at length with the patient today.  Concerns  regarding medicines are outlined above.  Orders Placed This Encounter  Procedures   EKG 12-Lead   No orders of the defined types were placed in this encounter.    No chief complaint on file.    History of Present Illness:    Caroline Dean is a 82 y.o. female.  Patient has past medical history of persistent atrial fibrillation, essential hypertension, mixed dyslipidemia.  She denies any problems at this time and takes care of activities of daily living.  No chest pain orthopnea or PND.  She ambulates age appropriately.  At the time of my evaluation, the patient is alert awake oriented and in no distress.  Past Medical History:  Diagnosis Date   Age-related osteoporosis without current pathological fracture 10/28/2015   AGITATION 08/15/2007   Qualifier: Diagnosis of  By: Clydia Llano, [place]     Anxiety 10/28/2015   Atrial fibrillation (HCC) 12/13/2019   Calculus of gallbladder without cholecystitis without obstruction 08/28/2020   Cataract    Diverticulosis    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/2007   Qualifier: Diagnosis of   By: Clydia Llano, [place]      Replacing diagnoses that were inactivated after the 09/21/22 regulatory import     Dyslipidemia 01/24/2015   Dysphagia    Essential hypertension 12/14/2019   Fatty liver    FATTY LIVER DISEASE 05/11/2008   Qualifier: Diagnosis of  By: Juanda Chance MD, Hedwig Morton    Gastro-esophageal reflux disease with esophagitis 10/28/2015   GERD (gastroesophageal reflux disease)    High risk medication use 10/28/2015   Hyperlipemia    Hyperplastic colon  polyp    Hypertension    Internal hemorrhoids    Iron deficiency anemia, unspecified    Malaise and fatigue 10/28/2015   Mixed hyperlipidemia 10/28/2015   Nephrolithiasis 10/28/2015   Obesity (BMI 30.0-34.9) 12/08/2021   Osteoarthritis    PAF (paroxysmal atrial fibrillation) (HCC) 01/18/2020   Palpitations 01/24/2015   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   Prediabetes 05/17/2019    Primary osteoarthritis involving multiple joints 10/28/2015   S/P Nissen fundoplication (without gastrostomy tube) procedure 07/14/2019   Shortness of breath 01/24/2015   Substance abuse (HCC)    Tachycardia    Ulcerative colitis (HCC) 08/15/2007   Qualifier: Diagnosis of  By: Clydia Llano, [place]     Vitamin B12 deficiency    Vitamin D deficiency 10/28/2015    Past Surgical History:  Procedure Laterality Date   BIOPSY  06/19/2019   Procedure: BIOPSY;  Surgeon: Napoleon Form, MD;  Location: [facility];  Service: Endoscopy;;   COLONOSCOPY  01/23/2021   numerous due to UC--2019- Dr Charm Barges, not in epic,  2013, 2010,2007, 2004 and earler   DILATION AND CURETTAGE OF UTERUS     ESOPHAGEAL MANOMETRY N/A 06/19/2019   Procedure: ESOPHAGEAL MANOMETRY (EM);  Surgeon: Napoleon Form, MD;  Location: [facility];  Service: Endoscopy;  Laterality: N/A;   ESOPHAGOGASTRODUODENOSCOPY (EGD) WITH PROPOFOL N/A 06/19/2019   Procedure: ESOPHAGOGASTRODUODENOSCOPY (EGD) WITH PROPOFOL;  Surgeon: Napoleon Form, MD;  Location: [facility];  Service: Endoscopy;  Laterality: N/A;  with manometry probe placement   HIATAL HERNIA REPAIR     January 2021   TONSILLECTOMY AND ADENOIDECTOMY      Current Medications: Current Meds  Medication Sig   ALPRAZolam (XANAX) 0.25 MG tablet Take 0.25 mg by mouth daily as needed for anxiety.    amiodarone (PACERONE) 200 MG tablet Take 1 tablet (200 mg total) by mouth daily.   Cholecalciferol 25 MCG (1000 UT) tablet Take 3,000 Units by mouth daily.    Cyanocobalamin 1000 MCG/ML KIT Inject 1,000 mcg as directed every 30 (thirty) days.   diltiazem (CARDIZEM CD) 120 MG 24 hr capsule Take 1 capsule (120 mg total) by mouth daily.   ELIQUIS 5 MG TABS tablet TAKE 1 TABLET(5 MG) BY MOUTH TWICE DAILY   ferrous sulfate 325 (65 FE) MG tablet Take 325 mg by mouth 3 (three) times a week.    folic acid (FOLVITE) 1 MG tablet TAKE 1 TABLET BY MOUTH EVERY DAY    losartan (COZAAR) 50 MG tablet Take 1 tablet (50 mg total) by mouth daily.   metoprolol tartrate (LOPRESSOR) 50 MG tablet Take 25 mg by mouth 2 (two) times daily.   omeprazole (PRILOSEC) 20 MG capsule Take 20 mg by mouth daily.   Probiotic Product (ALIGN) 4 MG CAPS Take 1 capsule by mouth daily.   simvastatin (ZOCOR) 20 MG tablet Take 20 mg by mouth daily.    sodium chloride (OCEAN) 0.65 % SOLN nasal spray Place 1 spray into both nostrils as needed for congestion.   sulfaSALAzine (AZULFIDINE) 500 MG tablet TAKE 2 TABLETS BY MOUTH TWICE DAILY   Current Facility-Administered Medications for the 02/04/23 encounter (Office Visit) with , Aundra Dubin, MD  Medication   0.9 %  sodium chloride infusion     Allergies:   Codeine   Social History   Socioeconomic History   Marital status: Single    Spouse name: Not on file   Number of children: 0   Years of education: Not on file   Highest education  level: Not on file  Occupational History   Occupation: retired  Tobacco Use   Smoking status: Never    Passive exposure: Never   Smokeless tobacco: Never  Vaping Use   Vaping status: Never Used  Substance and Sexual Activity   Alcohol use: Yes    Comment: occasional   Drug use: No   Sexual activity: Not on file  Other Topics Concern   Not on file  Social History Narrative   Not on file   Social Determinants of Health   Financial Resource Strain: Low Risk  (11/14/2020)   [facility] prior to 08/22/2022., [facility] visits prior to 08/22/2022.   Overall Financial Resource Strain (CARDIA)    Difficulty of Paying Living Expenses: Not very hard  Food Insecurity: No Food Insecurity (11/14/2020)   Received from [facility] visits prior to 08/22/2022., [facility] visits prior to 08/22/2022.   Hunger Vital Sign    Worried About Running Out of Food in the Last Year: Never true    Ran Out of  Food in the Last Year: Never true  Transportation Needs: No Transportation Needs (11/14/2020)   Received from [facility] visits prior to 08/22/2022., [facility] visits prior to 08/22/2022.   PRAPARE - Administrator, Civil Service (Medical): No    Lack of Transportation (Non-Medical): No  Physical Activity: Insufficiently Active (11/14/2020)   Received from [facility] visits prior to 08/22/2022., [facility] visits prior to 08/22/2022.   Exercise Vital Sign    Days of Exercise per Week: 4 days    Minutes of Exercise per Session: 30 min  Stress: No Stress Concern Present (11/14/2020)   [facility] prior to 08/22/2022., [facility] visits prior to 08/22/2022.   [employer] of Occupational Health - Occupational Stress Questionnaire    Feeling of Stress : Only a little  Social Connections: Moderately Integrated (11/14/2020)   Received from [facility] visits prior to 08/22/2022., [facility] visits prior to 08/22/2022.   Social Advertising account executive [NHANES]    Frequency of Communication with Friends and Family: Not on file    Frequency of Social Gatherings with Friends and Family: More than three times a week    Attends Religious Services: More than 4 times per year    Active Member of [employer] or Organizations: Yes    Attends Engineer, structural: More than 4 times per year    Marital Status: Never married     Family History: The patient's family history includes Cirrhosis in an other family member; Diabetes in her mother; Heart disease in her father; Lymphoma in her brother. There is no history of Colon cancer.  ROS:   Please see the history of present illness.    All other systems reviewed and are negative.  EKGs/Labs/Other Studies Reviewed:    The following studies were  reviewed today: .Marland KitchenEKG Interpretation Date/Time:  Thursday February 04 2023 15:08:17 EDT Ventricular Rate:  82 PR Interval:    QRS Duration:  80 QT Interval:  380 QTC Calculation: 443 R Axis:   94  Text Interpretation: Atrial fibrillation Rightward axis Nonspecific T wave abnormality Abnormal ECG When compared with ECG of 14-Jul-2019 20:31, Atrial fibrillation has replaced Sinus rhythm Questionable change in  QRS axis Nonspecific T wave abnormality, improved in Lateral leads Confirmed by Belva Crome [phone]) on 02/04/2023 3:32:08 PM     Recent Labs: 07/02/2022: Hemoglobin 12.7; Platelets 228 07/24/2022: ALT 24; BUN 21; Creatinine, Ser 0.94; Potassium 4.4; Sodium 141  Recent Lipid Panel    Component Value Date/Time   CHOL 142 12/24/2021 0810   TRIG 107 12/24/2021 0810   HDL 56 12/24/2021 0810   CHOLHDL 2.5 12/24/2021 0810   LDLCALC 67 12/24/2021 0810    Physical Exam:    VS:  BP (!) 158/96   Pulse 82   Ht 5\' 6"  (1.676 m)   Wt 198 lb 9.6 oz (90.1 kg)   SpO2 93%   BMI 32.05 kg/m     Wt Readings from Last 3 Encounters:  02/04/23 198 lb 9.6 oz (90.1 kg)  12/16/22 199 lb (90.3 kg)  07/24/22 189 lb (85.7 kg)     GEN: Patient is in no acute distress HEENT: Normal NECK: No JVD; No carotid bruits LYMPHATICS: No lymphadenopathy CARDIAC: Hear sounds regular, 2/6 systolic murmur at the apex. RESPIRATORY:  Clear to auscultation without rales, wheezing or rhonchi  ABDOMEN: Soft, non-tender, non-distended MUSCULOSKELETAL:  No edema; No deformity  SKIN: Warm and dry NEUROLOGIC:  Alert and oriented x 3 PSYCHIATRIC:  Normal affect   Signed, Garwin Brothers, MD  02/04/2023 3:46 PM    [facility]

## 2023-02-04 NOTE — Patient Instructions (Signed)
Please keep a BP log for 2 weeks and send by MyChart or mail.  Blood Pressure Record Sheet To take your blood pressure, you will need a blood pressure machine. You can buy a blood pressure machine (blood pressure monitor) at your clinic, drug store, or online. When choosing one, consider: An automatic monitor that has an arm cuff. A cuff that wraps snugly around your upper arm. You should be able to fit only one finger between your arm and the cuff. A device that stores blood pressure reading results. Do not choose a monitor that measures your blood pressure from your wrist or finger. Follow your health care provider's instructions for how to take your blood pressure. To use this form: Get one reading in the morning (a.m.) 1-2 hours after you take any medicines. Get one reading in the evening (p.m.) before supper.   Blood pressure log Date: _______________________  a.m. _____________________(1st reading) HR___________            p.m. _____________________(2nd reading) HR__________  Date: _______________________  a.m. _____________________(1st reading) HR___________            p.m. _____________________(2nd reading) HR__________  Date: _______________________  a.m. _____________________(1st reading) HR___________            p.m. _____________________(2nd reading) HR__________  Date: _______________________  a.m. _____________________(1st reading) HR___________            p.m. _____________________(2nd reading) HR__________  Date: _______________________  a.m. _____________________(1st reading) HR___________            p.m. _____________________(2nd reading) HR__________  Date: _______________________  a.m. _____________________(1st reading) HR___________            p.m. _____________________(2nd reading) HR__________  Date: _______________________  a.m. _____________________(1st reading) HR___________            p.m. _____________________(2nd reading)  HR__________   This information is not intended to replace advice given to you by your health care provider. Make sure you discuss any questions you have with your health care provider. Document Revised: 09/27/2019 Document Reviewed: 09/27/2019 Elsevier Patient Education  2021 Elsevier Inc.   Medication Instructions:  Your physician has recommended you make the following change in your medication:   Stop Amiodarone  Increase your Diltiazem to 240 mg daily  Stop Lopressor (metoprolol tartrate)  Start Toprol XL (metoprolol succinate) 50 mg daily  *If you need a refill on your cardiac medications before your next appointment, please call your pharmacy*   Lab Work: None ordered If you have labs (blood work) drawn today and your tests are completely normal, you will receive your results only by: MyChart Message (if you have MyChart) OR A paper copy in the mail If you have any lab test that is abnormal or we need to change your treatment, we will call you to review the results.   Testing/Procedures: None ordered   Follow-Up: At [facility], you and your health needs are our priority.  As part of our continuing mission to provide you with exceptional heart care, we have created designated Provider Care Teams.  These Care Teams include your primary Cardiologist (physician) and Advanced Practice Providers (APPs -  Physician Assistants and Nurse Practitioners) who all work together to provide you with the care you need, when you need it.  We recommend signing up for the patient portal called "MyChart".  Sign up information is provided on this After Visit Summary.  MyChart is used to connect with patients for Virtual Visits (Telemedicine).  Patients are able to view  lab/test results, encounter notes, upcoming appointments, etc.  Non-urgent messages can be sent to your provider as well.   To learn more about what you can do with MyChart, go to [email or web address].    Your  next appointment:   2 month(s)  The format for your next appointment:   In Person  Provider:   Belva Crome, MD    Other Instructions none  Important Information About Sugar

## 2023-04-07 ENCOUNTER — Ambulatory Visit: Payer: Medicare Other | Admitting: Cardiology

## 2023-04-20 ENCOUNTER — Ambulatory Visit: Payer: Medicare Other | Attending: Cardiology | Admitting: Cardiology

## 2023-04-20 ENCOUNTER — Encounter: Payer: Self-pay | Admitting: Cardiology

## 2023-04-20 VITALS — BP 152/80 | HR 84 | Ht 66.0 in | Wt 202.4 lb

## 2023-04-20 DIAGNOSIS — I4891 Unspecified atrial fibrillation: Secondary | ICD-10-CM

## 2023-04-20 DIAGNOSIS — E66811 Obesity, class 1: Secondary | ICD-10-CM

## 2023-04-20 DIAGNOSIS — E782 Mixed hyperlipidemia: Secondary | ICD-10-CM | POA: Diagnosis not present

## 2023-04-20 DIAGNOSIS — I10 Essential (primary) hypertension: Secondary | ICD-10-CM

## 2023-04-20 NOTE — Patient Instructions (Signed)
Medication Instructions:  Your physician recommends that you continue on your current medications as directed. Please refer to the Current Medication list given to you today.  *If you need a refill on your cardiac medications before your next appointment, please call your pharmacy*   Lab Work: None Ordered If you have labs (blood work) drawn today and your tests are completely normal, you will receive your results only by: MyChart Message (if you have MyChart) OR A paper copy in the mail If you have any lab test that is abnormal or we need to change your treatment, we will call you to review the results.   Testing/Procedures: None Ordered   Follow-Up: At [facility], you and your health needs are our priority.  As part of our continuing mission to provide you with exceptional heart care, we have created designated Provider Care Teams.  These Care Teams include your primary Cardiologist (physician) and Advanced Practice Providers (APPs -  Physician Assistants and Nurse Practitioners) who all work together to provide you with the care you need, when you need it.  We recommend signing up for the patient portal called "MyChart".  Sign up information is provided on this After Visit Summary.  MyChart is used to connect with patients for Virtual Visits (Telemedicine).  Patients are able to view lab/test results, encounter notes, upcoming appointments, etc.  Non-urgent messages can be sent to your provider as well.   To learn more about what you can do with MyChart, go to [email or web address].    Your next appointment:   9 month(s)  The format for your next appointment:   In Person  Provider:   Rajan Revankar, MD    Other Instructions NA  

## 2023-04-20 NOTE — Progress Notes (Signed)
Cardiology Office Note:    Date:  04/20/2023   ID:  Caroline Dean, DOB [birth year], MRN [id number]  PCP:  Krystal Clark, NP  Cardiologist:  Garwin Brothers, MD   Referring MD: Rhea Bleacher*    ASSESSMENT:    1. Atrial fibrillation, unspecified type (HCC)   2. Essential hypertension   3. Obesity (BMI 30.0-34.9)   4. Mixed hyperlipidemia    PLAN:    In order of problems listed above:  Primary prevention stressed with the patient.  Importance of compliance with diet medication stressed and patient verbalized standing. Essential hypertension: Blood pressure stable and diet was emphasized.  Lifestyle modification urged.  Her blood pressure at home with in the range of 130/77 heart rate to aggressively pursue any blood pressure lowering because this can be of adverse consequence to her. Permanent A-fib :I discussed with the patient atrial fibrillation, disease process. Management and therapy including rate and rhythm control, anticoagulation benefits and potential risks were discussed extensively with the patient. Patient had multiple questions which were answered to patient's satisfaction.  It is to be noted that she does not take amiodarone at this time. Mixed dyslipidemia: On lipid-lowering medications followed by primary care. Obesity: Weight reduction stressed and diet emphasized and she promises to do better. Patient will be seen in follow-up appointment in 6 months or earlier if the patient has any concerns.    Medication Adjustments/Labs and Tests Ordered: Current medicines are reviewed at length with the patient today.  Concerns regarding medicines are outlined above.  No orders of the defined types were placed in this encounter.  No orders of the defined types were placed in this encounter.    No chief complaint on file.    History of Present Illness:    Caroline Dean is a 82 y.o. female.  Patient has past medical history of permanent  atrial fibrillation, essential hypertension, obesity and mixed dyslipidemia.  She denies any problems at this time and takes care of activities of daily living.  No chest pain orthopnea or PND.  At the time of my evaluation, the patient is alert awake oriented and in no distress.  She goes bowling on a regular basis and also plays golf.  At the time of my evaluation, the patient is alert awake oriented and in no distress.  Past Medical History:  Diagnosis Date   Age-related osteoporosis without current pathological fracture 10/28/2015   AGITATION 08/15/2007   Qualifier: Diagnosis of  By: Caroline Dean, Caroline Dean     Anxiety 10/28/2015   Atrial fibrillation (HCC) 12/13/2019   Calculus of gallbladder without cholecystitis without obstruction 08/28/2020   Cataract    Diverticulosis    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/2007   Qualifier: Diagnosis of   By: Caroline Dean, Caroline Dean      Replacing diagnoses that were inactivated after the 09/21/22 regulatory import     Dyslipidemia 01/24/2015   Dysphagia    Essential hypertension 12/14/2019   Fatty liver    FATTY LIVER DISEASE 05/11/2008   Qualifier: Diagnosis of  By: Juanda Chance MD, Hedwig Morton    Gastro-esophageal reflux disease with esophagitis 10/28/2015   GERD (gastroesophageal reflux disease)    High risk medication use 10/28/2015   Hyperlipemia    Hyperplastic colon polyp    Hypertension    Internal hemorrhoids    Iron deficiency anemia, unspecified    Malaise and fatigue 10/28/2015   Mixed hyperlipidemia 10/28/2015   Nephrolithiasis 10/28/2015   Obesity (BMI  30.0-34.9) 12/08/2021   Osteoarthritis    PAF (paroxysmal atrial fibrillation) (HCC) 01/18/2020   Palpitations 01/24/2015   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   Prediabetes 05/17/2019   Primary osteoarthritis involving multiple joints 10/28/2015   S/P Nissen fundoplication (without gastrostomy tube) procedure 07/14/2019   Shortness of breath 01/24/2015   Substance abuse (HCC)     Tachycardia    Ulcerative colitis (HCC) 08/15/2007   Qualifier: Diagnosis of  By: Caroline Dean, Caroline Dean     Vitamin B12 deficiency    Vitamin D deficiency 10/28/2015    Past Surgical History:  Procedure Laterality Date   BIOPSY  06/19/2019   Procedure: BIOPSY;  Surgeon: Napoleon Form, MD;  [facility]: [facility];  Service: Endoscopy;;   COLONOSCOPY  01/23/2021   numerous due to UC--2019- Dr Charm Barges, not in epic,  2013, 2010,2007, 2004 and earler   DILATION AND CURETTAGE OF UTERUS     ESOPHAGEAL MANOMETRY N/A 06/19/2019   Procedure: ESOPHAGEAL MANOMETRY (EM);  Surgeon: Napoleon Form, MD;  [facility];  Service: Endoscopy;  Laterality: N/A;   ESOPHAGOGASTRODUODENOSCOPY (EGD) WITH PROPOFOL N/A 06/19/2019   Procedure: ESOPHAGOGASTRODUODENOSCOPY (EGD) WITH PROPOFOL;  Surgeon: Napoleon Form, MD;  [facility];  Service: Endoscopy;  Laterality: N/A;  with manometry probe placement   HIATAL HERNIA REPAIR     January 2021   TONSILLECTOMY AND ADENOIDECTOMY      Current Medications: Current Meds  Medication Sig   ALPRAZolam (XANAX) 0.25 MG tablet Take 0.25 mg by mouth daily as needed for anxiety.    amiodarone (PACERONE) 200 MG tablet Take 1 tablet (200 mg total) by mouth daily.   Cholecalciferol 25 MCG (1000 UT) tablet Take 3,000 Units by mouth daily.    Cyanocobalamin 1000 MCG/ML KIT Inject 1,000 mcg as directed every 30 (thirty) days.   diltiazem (CARDIZEM CD) 240 MG 24 hr capsule Take 1 capsule (240 mg total) by mouth daily.   ELIQUIS 5 MG TABS tablet TAKE 1 TABLET(5 MG) BY MOUTH TWICE DAILY   ferrous sulfate 325 (65 FE) MG tablet Take 325 mg by mouth 3 (three) times a week.    folic acid (FOLVITE) 1 MG tablet TAKE 1 TABLET BY MOUTH EVERY DAY   furosemide (LASIX) 20 MG tablet Take 20 mg by mouth as needed for fluid or edema.   losartan (COZAAR) 50 MG tablet Take 1 tablet (50 mg total) by mouth daily.   metoprolol succinate (TOPROL XL) 50 MG 24  hr tablet Take 1 tablet (50 mg total) by mouth daily.   omeprazole (PRILOSEC) 20 MG capsule Take 20 mg by mouth daily.   Probiotic Product (ALIGN) 4 MG CAPS Take 1 capsule by mouth daily.   simvastatin (ZOCOR) 20 MG tablet Take 20 mg by mouth daily.    sulfaSALAzine (AZULFIDINE) 500 MG tablet TAKE 2 TABLETS BY MOUTH TWICE DAILY   Current Facility-Administered Medications for the 04/20/23 encounter (Office Visit) with Mylo Driskill, Aundra Dubin, MD  Medication   0.9 %  sodium chloride infusion     Allergies:   Codeine   Social History   Socioeconomic History   Marital status: Single    Spouse name: Not on file   Number of children: 0   Years of education: Not on file   Highest education level: Not on file  Occupational History   Occupation: retired  Tobacco Use   Smoking status: Never    Passive exposure: Never   Smokeless tobacco: Never  Vaping Use   Vaping  status: Never Used  Substance and Sexual Activity   Alcohol use: Yes    Comment: occasional   Drug use: No   Sexual activity: Not on file  Other Topics Concern   Not on file  Social History Narrative   Not on file   Social Determinants of Health   Financial Resource Strain: Low Risk  (11/14/2020)   [facility] prior to 08/22/2022., [facility] visits prior to 08/22/2022.   Overall Financial Resource Strain (CARDIA)    Difficulty of Paying Living Expenses: Not very hard  Food Insecurity: Low Risk  (02/07/2023)   [facility]   Hunger Vital Sign    Worried About Running Out of Food in the Last Year: Never true    Ran Out of Food in the Last Year: Never true  Transportation Needs: No Transportation Needs (02/07/2023)   Received from [employer]    In the past 12 months, has lack of reliable transportation kept you from medical appointments, meetings, work or from getting things needed for daily living? : No  Physical Activity:  Insufficiently Active (11/14/2020)   Received from [facility] visits prior to 08/22/2022., [facility] visits prior to 08/22/2022.   Exercise Vital Sign    Days of Exercise per Week: 4 days    Minutes of Exercise per Session: 30 min  Stress: No Stress Concern Present (11/14/2020)   [facility] visits prior to 08/22/2022., [facility] visits prior to 08/22/2022.   [employer] of Occupational Health - Occupational Stress Questionnaire    Feeling of Stress : Only a little  Social Connections: Moderately Integrated (11/14/2020)   Received from [facility] visits prior to 08/22/2022., [facility] visits prior to 08/22/2022.   Social Advertising account executive [NHANES]    Frequency of Communication with Friends and Family: Not on file    Frequency of Social Gatherings with Friends and Family: More than three times a week    Attends Religious Services: More than 4 times per year    Active Member of [employer] or Organizations: Yes    Attends Engineer, structural: More than 4 times per year    Marital Status: Never married     Family History: The patient's family history includes Cirrhosis in an other family member; Diabetes in her mother; Heart disease in her father; Lymphoma in her brother. There is no history of Colon cancer.  ROS:   Please see the history of present illness.    All other systems reviewed and are negative.  EKGs/Labs/Other Studies Reviewed:    The following studies were reviewed today: I discussed my findings with the patient at length   Recent Labs: 07/02/2022: Hemoglobin 12.7; Platelets 228 07/24/2022: ALT 24; BUN 21; Creatinine, Ser 0.94; Potassium 4.4; Sodium 141  Recent Lipid Panel    Component Value Date/Time   CHOL 142 12/24/2021 0810   TRIG 107 12/24/2021 0810   HDL 56 12/24/2021 0810   CHOLHDL 2.5 12/24/2021 0810    LDLCALC 67 12/24/2021 0810    Physical Exam:    VS:  BP (!) 152/80   Pulse 84   Ht 5\' 6"  (1.676 m)   Wt 202 lb 6.4 oz (91.8 kg)   SpO2 95%   BMI 32.67 kg/m     Wt Readings from Last  3 Encounters:  04/20/23 202 lb 6.4 oz (91.8 kg)  02/04/23 198 lb 9.6 oz (90.1 kg)  12/16/22 199 lb (90.3 kg)     GEN: Patient is in no acute distress HEENT: Normal NECK: No JVD; No carotid bruits LYMPHATICS: No lymphadenopathy CARDIAC: Hear sounds regular, 2/6 systolic murmur at the apex. RESPIRATORY:  Clear to auscultation without rales, wheezing or rhonchi  ABDOMEN: Soft, non-tender, non-distended MUSCULOSKELETAL:  No edema; No deformity  SKIN: Warm and dry NEUROLOGIC:  Alert and oriented x 3 PSYCHIATRIC:  Normal affect   Signed, Garwin Brothers, MD  04/20/2023 4:12 PM    [facility]

## 2023-04-24 ENCOUNTER — Other Ambulatory Visit: Payer: Self-pay | Admitting: Gastroenterology

## 2023-05-29 ENCOUNTER — Other Ambulatory Visit: Payer: Self-pay | Admitting: Cardiology

## 2023-05-29 DIAGNOSIS — I10 Essential (primary) hypertension: Secondary | ICD-10-CM

## 2023-06-04 ENCOUNTER — Other Ambulatory Visit: Payer: Self-pay | Admitting: Cardiology

## 2023-06-15 ENCOUNTER — Other Ambulatory Visit: Payer: Self-pay | Admitting: Cardiology

## 2023-06-15 DIAGNOSIS — I480 Paroxysmal atrial fibrillation: Secondary | ICD-10-CM

## 2023-06-15 NOTE — Telephone Encounter (Signed)
Prescription refill request for Eliquis received. Indication: a fib Last office visit: 04/20/23 Scr: 0.94 epic 07/24/22 Age: 82 Weight: 91 kg

## 2023-09-08 ENCOUNTER — Encounter: Payer: Self-pay | Admitting: Gastroenterology

## 2023-09-25 ENCOUNTER — Other Ambulatory Visit: Payer: Self-pay | Admitting: Cardiology

## 2023-10-28 ENCOUNTER — Other Ambulatory Visit: Payer: Self-pay | Admitting: Cardiology

## 2023-10-28 NOTE — Telephone Encounter (Signed)
 Rx refill sent to pharmacy.

## 2023-11-09 ENCOUNTER — Other Ambulatory Visit: Payer: Self-pay | Admitting: Cardiology

## 2023-11-09 DIAGNOSIS — I480 Paroxysmal atrial fibrillation: Secondary | ICD-10-CM

## 2023-11-09 NOTE — Telephone Encounter (Signed)
 Pt last saw Dr Lafayette Pierre 04/20/23, last labs 08/17/23 Creat 0.85, age 83, weight 91.8kg, based on specified criteria pt is on appropriate dosage of Eliquis  5mg  BID for afib.  Will refill rx.

## 2023-11-24 ENCOUNTER — Other Ambulatory Visit (INDEPENDENT_AMBULATORY_CARE_PROVIDER_SITE_OTHER)

## 2023-11-24 ENCOUNTER — Ambulatory Visit: Admitting: Physician Assistant

## 2023-11-24 ENCOUNTER — Ambulatory Visit (INDEPENDENT_AMBULATORY_CARE_PROVIDER_SITE_OTHER)

## 2023-11-24 ENCOUNTER — Encounter: Payer: Self-pay | Admitting: Physician Assistant

## 2023-11-24 VITALS — BP 144/82 | HR 92 | Ht 66.0 in | Wt 188.0 lb

## 2023-11-24 DIAGNOSIS — K5180 Other ulcerative colitis without complications: Secondary | ICD-10-CM

## 2023-11-24 DIAGNOSIS — Z860100 Personal history of colon polyps, unspecified: Secondary | ICD-10-CM

## 2023-11-24 DIAGNOSIS — K5190 Ulcerative colitis, unspecified, without complications: Secondary | ICD-10-CM

## 2023-11-24 DIAGNOSIS — Z860101 Personal history of adenomatous and serrated colon polyps: Secondary | ICD-10-CM | POA: Diagnosis not present

## 2023-11-24 LAB — COMPREHENSIVE METABOLIC PANEL WITH GFR
ALT: 13 U/L (ref 0–35)
AST: 15 U/L (ref 0–37)
Albumin: 4.3 g/dL (ref 3.5–5.2)
Alkaline Phosphatase: 75 U/L (ref 39–117)
BUN: 16 mg/dL (ref 6–23)
CO2: 21 meq/L (ref 19–32)
Calcium: 9.2 mg/dL (ref 8.4–10.5)
Chloride: 107 meq/L (ref 96–112)
Creatinine, Ser: 0.99 mg/dL (ref 0.40–1.20)
GFR: 53.31 mL/min — ABNORMAL LOW (ref >60.00)
Glucose, Bld: 94 mg/dL (ref 70–99)
Potassium: 3.5 meq/L (ref 3.5–5.1)
Sodium: 138 meq/L (ref 135–145)
Total Bilirubin: 0.9 mg/dL (ref 0.2–1.2)
Total Protein: 6.9 g/dL (ref 6.0–8.3)

## 2023-11-24 LAB — CBC WITH DIFFERENTIAL/PLATELET
Basophils Absolute: 0.2 10*3/uL — ABNORMAL HIGH (ref 0.0–0.1)
Basophils Relative: 2.8 % (ref 0.0–3.0)
Eosinophils Absolute: 0.1 10*3/uL (ref 0.0–0.7)
Eosinophils Relative: 2 % (ref 0.0–5.0)
HCT: 40.5 % (ref 36.0–46.0)
Hemoglobin: 13.4 g/dL (ref 12.0–15.0)
Lymphocytes Relative: 22.9 % (ref 12.0–46.0)
Lymphs Abs: 1.3 10*3/uL (ref 0.7–4.0)
MCHC: 33.2 g/dL (ref 30.0–36.0)
MCV: 100.4 fl — ABNORMAL HIGH (ref 78.0–100.0)
Monocytes Absolute: 0.7 10*3/uL (ref 0.1–1.0)
Monocytes Relative: 12.2 % — ABNORMAL HIGH (ref 3.0–12.0)
Neutro Abs: 3.5 10*3/uL (ref 1.4–7.7)
Neutrophils Relative %: 60.1 % (ref 43.0–77.0)
Platelets: 190 10*3/uL (ref 150.0–400.0)
RBC: 4.03 Mil/uL (ref 3.87–5.11)
RDW: 13.5 % (ref 11.5–15.5)
WBC: 5.8 10*3/uL (ref 4.0–10.5)

## 2023-11-24 LAB — IBC + FERRITIN
Ferritin: 156.5 ng/mL (ref 10.0–291.0)
Iron: 127 ug/dL (ref 42–145)
Saturation Ratios: 46.3 % (ref 20.0–50.0)
TIBC: 274.4 ug/dL (ref 250.0–450.0)
Transferrin: 196 mg/dL — ABNORMAL LOW (ref 212.0–360.0)

## 2023-11-24 LAB — VITAMIN D 25 HYDROXY (VIT D DEFICIENCY, FRACTURES): VITD: 49.24 ng/mL (ref 30.00–100.00)

## 2023-11-24 LAB — C-REACTIVE PROTEIN: CRP: <1 mg/dL (ref 0.5–20.0)

## 2023-11-24 MED ORDER — SULFASALAZINE 500 MG PO TABS: 1000.0000 mg | ORAL_TABLET | Freq: Two times a day (BID) | ORAL | 3 refills | Status: DC

## 2023-11-24 NOTE — Addendum Note (Signed)
 Addended by: Cain Castillo on: 11/24/2023 01:50 PM   Modules accepted: Orders

## 2023-11-24 NOTE — Progress Notes (Signed)
 Chief Complaint: Follow-up ulcerative colitis  HPI:    Caroline Dean is an 83 year old female with a past medical history as listed below including A-fib, large hiatal hernia status post Nissen fundoplication, anxiety, fatty liver and ulcerative colitis, known to Dr. Leonia Raman, who returns to clinic today for follow-up of her ulcerative colitis.     01/23/2021 colonoscopy with three 8-14 mm polyps, decreased mucosal vascular pattern in the entire examined colon, pseudopolyps in the colon and diverticulosis as well as nonbleeding external and internal hemorrhoids.    12/09/2021 patient seen in clinic by Dr. Nandigam at that time doing well.  Intermittent episodes of excessive gas and diarrhea once every 2 weeks.  She had tapered off of 6-MP and was taking Sulfasalazine .  At that time continued on Sulfasalazine  along with Folic acid .  Recommended CRP fecal calprotectin, B12 and folate/iron.  Patient was tapered off PPI and transition to Pepcid  twice a day.    12/11/2021 fecal calprotectin minimally elevated at 132.  B12 and iron normal.    05/26/2022 iron panel normal.  CBC normal.  CMP normal.    12/16/2022 patient seen in clinic and was doing well on Sulfasalazine  500 mg 2 tabs twice a day and Align daily.  Ordered labs to include a CRP, B12/folate and fecal calprotectin.    08/17/2023 CMP normal, ferritin normal, iron panel with transferrin minimally decreased at 202, TIBC minimally decreased to 289 otherwise normal.  TSH normal.  CBC with increased MCV and otherwise normal.    Today, patient presents to clinic and tells me that she is doing very well.  She takes her Sulfasalazine  500 mg 2 tabs twice a day and Align and has a daily bowel movement after drinking her coffee that is soft solid no abdominal pain, no bleeding.  She does ask if there is any need for her to have another colonoscopy.  She remains on Eliquis  for her A-fib.    She bowls every Tuesday and has done this for years, typically score is over  400.    Denies fever, chills or weight loss.  Past Medical History:  Diagnosis Date   Age-related osteoporosis without current pathological fracture 10/28/2015   AGITATION 08/15/2007   Qualifier: Diagnosis of  By: Calhoun Catalina, Norchel     Anxiety 10/28/2015   Atrial fibrillation (HCC) 12/13/2019   Calculus of gallbladder without cholecystitis without obstruction 08/28/2020   Cataract    Diverticulosis    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/2007   Qualifier: Diagnosis of   By: Calhoun Catalina, Norchel      Replacing diagnoses that were inactivated after the 09/21/22 regulatory import     Dyslipidemia 01/24/2015   Dysphagia    Essential hypertension 12/14/2019   Fatty liver    FATTY LIVER DISEASE 05/11/2008   Qualifier: Diagnosis of  By: Grandville Lax MD, Raj Burdock    Gastro-esophageal reflux disease with esophagitis 10/28/2015   GERD (gastroesophageal reflux disease)    High risk medication use 10/28/2015   Hyperlipemia    Hyperplastic colon polyp    Hypertension    Internal hemorrhoids    Iron deficiency anemia, unspecified    Malaise and fatigue 10/28/2015   Mixed hyperlipidemia 10/28/2015   Nephrolithiasis 10/28/2015   Obesity (BMI 30.0-34.9) 12/08/2021   Osteoarthritis    PAF (paroxysmal atrial fibrillation) (HCC) 01/18/2020   Palpitations 01/24/2015   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   Prediabetes 05/17/2019   Primary osteoarthritis involving multiple joints 10/28/2015   S/P Nissen fundoplication (without gastrostomy tube)  procedure 07/14/2019   Shortness of breath 01/24/2015   Substance abuse (HCC)    Tachycardia    Ulcerative colitis (HCC) 08/15/2007   Qualifier: Diagnosis of  By: Calhoun Catalina, Norchel     Vitamin B12 deficiency    Vitamin D  deficiency 10/28/2015    Past Surgical History:  Procedure Laterality Date   BIOPSY  06/19/2019   Procedure: BIOPSY;  Surgeon: Sergio Dandy, MD;  [facility]: [facility];  Service: Endoscopy;;   COLONOSCOPY   01/23/2021   numerous due to UC--2019- Dr Randal Bury, not in epic,  2013, 2010,2007, 2004 and earler   DILATION AND CURETTAGE OF UTERUS     ESOPHAGEAL MANOMETRY N/A 06/19/2019   Procedure: ESOPHAGEAL MANOMETRY (EM);  Surgeon: Sergio Dandy, MD;  [facility];  Service: Endoscopy;  Laterality: N/A;   ESOPHAGOGASTRODUODENOSCOPY (EGD) WITH PROPOFOL  N/A 06/19/2019   Procedure: ESOPHAGOGASTRODUODENOSCOPY (EGD) WITH PROPOFOL ;  Surgeon: Sergio Dandy, MD;  [facility]: [facility];  Service: Endoscopy;  Laterality: N/A;  with manometry probe placement   HIATAL HERNIA REPAIR     January 2021   TONSILLECTOMY AND ADENOIDECTOMY      Current Outpatient Medications  Medication Sig Dispense Refill   ALPRAZolam  (XANAX ) 0.25 MG tablet Take 0.25 mg by mouth daily as needed for anxiety.      Cholecalciferol 25 MCG (1000 UT) tablet Take 3,000 Units by mouth daily.      Cyanocobalamin  1000 MCG/ML KIT Inject 1,000 mcg as directed every 30 (thirty) days.     diltiazem  (CARDIZEM  CD) 240 MG 24 hr capsule Take 1 capsule (240 mg total) by mouth daily. 90 capsule 3   ELIQUIS  5 MG TABS tablet TAKE 1 TABLET(5 MG) BY MOUTH TWICE DAILY 180 tablet 1   ferrous sulfate 325 (65 FE) MG tablet Take 325 mg by mouth 3 (three) times a week.      folic acid  (FOLVITE ) 1 MG tablet TAKE 1 TABLET BY MOUTH EVERY DAY 100 tablet 3   furosemide (LASIX) 20 MG tablet Take 20 mg by mouth as needed for fluid or edema.     losartan  (COZAAR ) 50 MG tablet Take 1 tablet (50 mg total) by mouth daily. 90 tablet 2   metoprolol  succinate (TOPROL -XL) 50 MG 24 hr tablet Take 1 tablet (50 mg total) by mouth daily. 90 tablet 1   omeprazole  (PRILOSEC) 20 MG capsule Take 20 mg by mouth daily.     Probiotic Product (ALIGN) 4 MG CAPS Take 1 capsule by mouth daily.     simvastatin (ZOCOR) 20 MG tablet Take 20 mg by mouth daily.      sulfaSALAzine  (AZULFIDINE ) 500 MG tablet TAKE 2 TABLETS BY MOUTH TWICE DAILY 120 tablet 12   Current  Facility-Administered Medications  Medication Dose Route Frequency Provider Last Rate Last Admin   0.9 %  sodium chloride  infusion  500 mL Intravenous Continuous Nandigam, Kavitha V, MD        Allergies as of 11/24/2023 - Review Complete 11/24/2023  Allergen Reaction Noted   Codeine Nausea And Vomiting 05/10/2008    Family History  Problem Relation Age of Onset   Lymphoma Brother    Diabetes Mother    Heart disease Father    Cirrhosis Other        neice with liver transplant   Colon cancer Neg Hx     Social History   Socioeconomic History   Marital status: Single    Spouse name: Not on file   Number of children:  0   Years of education: Not on file   Highest education level: Not on file  Occupational History   Occupation: retired  Tobacco Use   Smoking status: Never    Passive exposure: Never   Smokeless tobacco: Never  Vaping Use   Vaping status: Never Used  Substance and Sexual Activity   Alcohol use: Yes    Comment: occasional   Drug use: No   Sexual activity: Not on file  Other Topics Concern   Not on file  Social History Narrative   Not on file   Social Drivers of Health   Financial Resource Strain: Low Risk  (11/14/2020)   [facility] prior to 08/22/2022., [facility] visits prior to 08/22/2022.   Overall Financial Resource Strain (CARDIA)    Difficulty of Paying Living Expenses: Not very hard  Food Insecurity: Low Risk  (08/14/2023)   [facility]   Hunger Vital Sign    Worried About Running Out of Food in the Last Year: Never true    Ran Out of Food in the Last Year: Never true  Transportation Needs: No Transportation Needs (08/14/2023)   [facility]   Transportation    In the past 12 months, has lack of reliable transportation kept you from medical appointments, meetings, work or from getting things needed for daily living? : No  Physical Activity:  Insufficiently Active (11/14/2020)   [facility] from [facility] visits prior to 08/22/2022., [facility] visits prior to 08/22/2022.   Exercise Vital Sign    Days of Exercise per Week: 4 days    Minutes of Exercise per Session: 30 min  Stress: No Stress Concern Present (11/14/2020)   [facility] visits prior to 08/22/2022., [facility] visits prior to 08/22/2022.   [employer] of Occupational Health - Occupational Stress Questionnaire    Feeling of Stress : Only a little  Social Connections: Moderately Integrated (11/14/2020)   [facility] from [facility] visits prior to 08/22/2022., [facility] visits prior to 08/22/2022.   Social Advertising account executive [NHANES]    Frequency of Communication with Friends and Family: Not on file    Frequency of Social Gatherings with Friends and Family: More than three times a week    Attends Religious Services: More than 4 times per year    Active Member of Clubs or Organizations: Yes    Attends Banker Meetings: More than 4 times per year    Marital Status: Never married  Intimate Partner Violence: Not At Risk (11/14/2020)   [facility] prior to 08/22/2022., [facility] visits prior to 08/22/2022.   Humiliation, Afraid, Rape, and Kick questionnaire    Fear of Current or Ex-Partner: No    Emotionally Abused: No    Physically Abused: No    Sexually Abused: No    Review of Systems:    Constitutional: No weight loss, fever or chills Cardiovascular: No chest pain Respiratory: No SOB Gastrointestinal: See HPI and otherwise negative   Physical Exam:  Vital signs: BP (!) 144/82   Pulse 92   Ht 5\' 6"  (1.676 m)   Wt 188 lb (85.3 kg)   BMI 30.34 kg/m    Constitutional:   Pleasant elderly Caucasian female appears to be in NAD, Well  developed, Well nourished, alert and cooperative Respiratory: Respirations even and unlabored. Lungs clear to auscultation bilaterally.   No wheezes, crackles, or rhonchi.  Cardiovascular: Normal S1, S2. No MRG.  Regular rate, irregularly irregular rhythm peripheral edema, cyanosis or pallor.  Gastrointestinal:  Soft, nondistended, nontender. No rebound or guarding. Normal bowel sounds. No appreciable masses or hepatomegaly. Rectal:  Not performed.  Psychiatric: Oriented to person, place and time. Demonstrates good judgement and reason without abnormal affect or behaviors.  RELEVANT LABS AND IMAGING: CBC    Component Value Date/Time   WBC 4.8 07/02/2022 0950   WBC 5.7 09/23/2021 0902   RBC 3.68 (L) 07/02/2022 0950   RBC 4.09 09/23/2021 0902   HGB 12.7 07/02/2022 0950   HCT 38.1 07/02/2022 0950   PLT 228 07/02/2022 0950   MCV 104 (H) 07/02/2022 0950   MCH 34.5 (H) 07/02/2022 0950   MCH 32.4 07/11/2019 1326   MCHC 33.3 07/02/2022 0950   MCHC 32.8 09/23/2021 0902   RDW 12.9 07/02/2022 0950   LYMPHSABS 1.0 09/23/2021 0902   MONOABS 0.5 09/23/2021 0902   EOSABS 0.1 09/23/2021 0902   BASOSABS 0.1 09/23/2021 0902    CMP     Component Value Date/Time   NA 141 07/24/2022 1414   K 4.4 07/24/2022 1414   CL 106 07/24/2022 1414   CO2 20 07/24/2022 1414   GLUCOSE 171 (H) 07/24/2022 1414   GLUCOSE 99 09/23/2021 0902   BUN 21 07/24/2022 1414   CREATININE 0.94 07/24/2022 1414   CALCIUM 8.5 (L) 07/24/2022 1414   PROT 6.4 07/24/2022 1414   ALBUMIN 4.2 07/24/2022 1414   AST 21 07/24/2022 1414   ALT 24 07/24/2022 1414   ALKPHOS 67 07/24/2022 1414   BILITOT 0.5 07/24/2022 1414   GFRNONAA >60 07/15/2019 0622   GFRAA >60 07/15/2019 0622    Assessment: 1.  Ulcerative colitis without complication: Doing well on Sulfasalazine  500 mg 2 tabs twice daily and daily align 2.  History of colon polyps: Last colonoscopy in 2022 with 3 large polyps 3.  A-fib on Eliquis   Plan: 1.  Will let Dr.  Leonia Raman review the chart.  It looks like patient would likely benefit from a colonoscopy, but she is in A-fib currently on exam today and currently on Eliquis , she would certainly be high risk for this procedure. 2.  Continue Sulfasalazine  500 mg 2 tabs twice daily #360 with 3 refills 3.  Labs today to include CBC, CMP, iron studies with ferritin, CRP, vitamin D  and fecal calprotectin (currently following with a separate physician for B12 deficiency) 4.  Continue Align 5.  Patient to follow in clinic in a year or sooner if Dr. Leonia Raman feels she would benefit from a colonoscopy.  Reginal Capra, PA-C [facility] 11/24/2023, 10:09 AM  Cc: Brown-Patram, Melissa J*

## 2023-11-24 NOTE — Patient Instructions (Signed)
 Your provider has requested that you go to the basement level for lab work before leaving today. Press "B" on the elevator. The lab is located at the first door on the left as you exit the elevator.  We have sent the following medications to your pharmacy for you to pick up at your convenience: Sulfsalazine.   _______________________________________________________  If your blood pressure at your visit was 140/90 or greater, please contact your primary care physician to follow up on this.  _______________________________________________________  If you are age 29 or older, your body mass index should be between 23-30. Your Body mass index is 30.34 kg/m. If this is out of the aforementioned range listed, please consider follow up with your Primary Care Provider.  If you are age 4 or younger, your body mass index should be between 19-25. Your Body mass index is 30.34 kg/m. If this is out of the aformentioned range listed, please consider follow up with your Primary Care Provider.   ________________________________________________________  The [facility] providers would like to encourage you to use MYCHART to communicate with providers for non-urgent requests or questions.  Due to long hold times on the telephone, sending your provider a message by [facility] may be a faster and more efficient way to get a response.  Please allow 48 business hours for a response.  Please remember that this is for non-urgent requests.  _______________________________________________________

## 2023-11-25 ENCOUNTER — Ambulatory Visit: Payer: Self-pay | Admitting: Physician Assistant

## 2023-11-27 ENCOUNTER — Other Ambulatory Visit: Payer: Self-pay | Admitting: Cardiology

## 2023-11-27 DIAGNOSIS — I10 Essential (primary) hypertension: Secondary | ICD-10-CM

## 2023-11-29 LAB — CALPROTECTIN, FECAL: Calprotectin, Fecal: 90 ug/g (ref 0–120)

## 2023-12-09 ENCOUNTER — Telehealth: Payer: Self-pay

## 2023-12-09 NOTE — Telephone Encounter (Signed)
 Patient contacted and advised of the recommendation.

## 2023-12-09 NOTE — Telephone Encounter (Signed)
-----   Message from Graciella Lavender sent at 12/09/2023  8:18 AM EDT ----- Regarding: FW: Please review Please let patient know that Dr. Leonia Raman also reviewed her chart and does not recommend any further surveillance colonoscopies.  Thanks-JLL ----- Message ----- From: Nandigam, Kavitha V, MD Sent: 12/08/2023   4:39 PM EDT To: Graciella Lavender, PA Subject: RE: Please review                              She is very active but as per recommendations we should stop surveillance colonoscopies after EGD, overall benefit tends to start going down, risk start to go up. ----- Message ----- From: Graciella Lavender, PA Sent: 11/24/2023  10:30 AM EDT To: Kavitha Nandigam V, MD Subject: Please review                                  Looks like patient is overdue for repeat colonoscopy but she is now in her 12s and was in A-fib on exam today, typically on Eliquis  for this.  Certainly she would be high risk to stop her Eliquis , but she did have 3 big polyps on last exam in 2022, please let me know your thoughts.  She is very active, bowls on Tuesdays and very well within herself.  Thanks, JL L

## 2023-12-21 ENCOUNTER — Other Ambulatory Visit: Payer: Self-pay | Admitting: Gastroenterology

## 2024-01-18 ENCOUNTER — Ambulatory Visit: Attending: Cardiology | Admitting: Cardiology

## 2024-01-18 ENCOUNTER — Encounter: Payer: Self-pay | Admitting: Cardiology

## 2024-01-18 VITALS — BP 146/84 | HR 82 | Ht 66.0 in | Wt 185.2 lb

## 2024-01-18 DIAGNOSIS — I10 Essential (primary) hypertension: Secondary | ICD-10-CM | POA: Diagnosis not present

## 2024-01-18 DIAGNOSIS — E66811 Obesity, class 1: Secondary | ICD-10-CM

## 2024-01-18 DIAGNOSIS — I4821 Permanent atrial fibrillation: Secondary | ICD-10-CM

## 2024-01-18 MED ORDER — APIXABAN 5 MG PO TABS: 5.0000 mg | ORAL_TABLET | Freq: Two times a day (BID) | ORAL | Status: DC

## 2024-01-18 NOTE — Addendum Note (Signed)
 Addended by: ONEITA BERLINER on: 01/18/2024 10:08 AM   Modules accepted: Orders

## 2024-01-18 NOTE — Patient Instructions (Signed)

## 2024-01-18 NOTE — Progress Notes (Signed)
 Cardiology Office Note:    Date:  01/18/2024   ID:  ZACARI RADICK, DOB 03/23/1941, MRN [id number]  PCP:  Benson Eleanor Rung, NP  Cardiologist:  Jennifer JONELLE Crape, MD   Referring MD: Benson Eleanor JINNY*    ASSESSMENT:    1. Permanent atrial fibrillation (HCC)   2. Primary hypertension   3. Obesity (BMI 30.0-34.9)    PLAN:    In order of problems listed above:  Primary prevention stressed with the patient.  Importance of compliance with diet medication stressed and patient verbalized standing. She was advised to walk at least half an hour a day on a daily basis  Essential hypertension: Blood pressure stable and diet was emphasized.  She checks her blood pressures at home and they are fine. Obesity: Weight reduction stressed diet emphasized. Permanent atrial fibrillation:I discussed with the patient atrial fibrillation, disease process. Management and therapy including rate and rhythm control, anticoagulation benefits and potential risks were discussed extensively with the patient. Patient had multiple questions which were answered to patient's satisfaction. Patient will be seen in follow-up appointment in 6 months or earlier if the patient has any concerns.    Medication Adjustments/Labs and Tests Ordered: Current medicines are reviewed at length with the patient today.  Concerns regarding medicines are outlined above.  No orders of the defined types were placed in this encounter.  No orders of the defined types were placed in this encounter.    No chief complaint on file.    History of Present Illness:    Caroline Dean is a 83 y.o. female.  Patient has past medical history of essential hypertension, permanent atrial fibrillation and obesity.  She is an active lady but does not exercise on a regular basis.  No chest pain orthopnea or PND.  She mentions that her blood pressure at home is fine.  At the time of my evaluation, the patient is alert awake  oriented and in no distress.  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   Qualifier: Diagnosis of   By: Rosabel Penny Balm, Norchel      Replacing diagnoses that were inactivated after the 09/21/22 regulatory import     Dyslipidemia 01/24/2015   Dysphagia    Essential hypertension 12/14/2019   Fatty liver    Gastro-esophageal reflux disease with esophagitis 10/28/2015   GERD (gastroesophageal reflux disease)    High risk medication use 10/28/2015   Hyperlipemia    Hyperplastic colon polyp    Hypertension    Iron deficiency anemia, unspecified    Malaise and fatigue 10/28/2015   Mixed hyperlipidemia 10/28/2015   Nephrolithiasis 10/28/2015   Obesity (BMI 30.0-34.9) 12/08/2021   On amiodarone  therapy 02/04/2023   Osteoarthritis    PAF (paroxysmal atrial fibrillation) (HCC) 01/18/2020   Palpitations 01/24/2015   Primary osteoarthritis involving multiple joints 10/28/2015   S/P Nissen fundoplication (without gastrostomy tube) procedure 07/14/2019   Shortness of breath 01/24/2015   Tachycardia    Ulcerative colitis (HCC) 08/15/2007   Qualifier: Diagnosis of  By: Rosabel Penny Balm, Norchel     Vitamin B12 deficiency    Vitamin D  deficiency 10/28/2015    Past Surgical History:  Procedure  Laterality Date   BIOPSY  06/19/2019   Procedure: BIOPSY;  Surgeon: Shila Gustav GAILS, MD;  Location: [facility];  Service: Endoscopy;;   COLONOSCOPY  01/23/2021   numerous due to UC--2019- Dr Towana, not in epic,  2013, 2010,2007, 2004 and earler   DILATION AND CURETTAGE OF UTERUS     ESOPHAGEAL MANOMETRY N/A 06/19/2019   Procedure: ESOPHAGEAL  MANOMETRY (EM);  Surgeon: Shila Gustav GAILS, MD;  Location: [facility];  Service: Endoscopy;  Laterality: N/A;   ESOPHAGOGASTRODUODENOSCOPY (EGD) WITH PROPOFOL  N/A 06/19/2019   Procedure: ESOPHAGOGASTRODUODENOSCOPY (EGD) WITH PROPOFOL ;  Surgeon: Shila Gustav GAILS, MD;  Location: [facility];  Service: Endoscopy;  Laterality: N/A;  with manometry probe placement   HIATAL HERNIA REPAIR     January 2021   TONSILLECTOMY AND ADENOIDECTOMY      Current Medications: Current Meds  Medication Sig   ALPRAZolam  (XANAX ) 0.25 MG tablet Take 0.25 mg by mouth daily as needed for anxiety.    Cholecalciferol 25 MCG (1000 UT) tablet Take 3,000 Units by mouth daily.    Cyanocobalamin  1000 MCG/ML KIT Inject 1,000 mcg as directed every 30 (thirty) days.   diltiazem  (CARDIZEM  CD) 240 MG 24 hr capsule Take 1 capsule (240 mg total) by mouth daily.   ELIQUIS  5 MG TABS tablet TAKE 1 TABLET(5 MG) BY MOUTH TWICE DAILY   ferrous sulfate 325 (65 FE) MG tablet Take 325 mg by mouth 3 (three) times a week.    folic acid  (FOLVITE ) 1 MG tablet TAKE 1 TABLET BY MOUTH EVERY DAY   furosemide (LASIX) 20 MG tablet Take 20 mg by mouth as needed for fluid or edema.   losartan  (COZAAR ) 50 MG tablet TAKE 1 TABLET(50 MG) BY MOUTH DAILY   metoprolol  succinate (TOPROL -XL) 50 MG 24 hr tablet Take 1 tablet (50 mg total) by mouth daily.   omeprazole  (PRILOSEC) 20 MG capsule Take 20 mg by mouth daily.   Probiotic Product (ALIGN) 4 MG CAPS Take 1 capsule by mouth daily.   simvastatin (ZOCOR) 20 MG tablet Take 20 mg by mouth daily.    sulfaSALAzine  (AZULFIDINE ) 500 MG tablet Take 2 tablets (1,000 mg total) by mouth 2 (two) times daily.   Current Facility-Administered Medications for the 01/18/24 encounter (Office Visit) with Keia Rask, Jennifer SAUNDERS, MD  Medication   0.9 %  sodium chloride  infusion     Allergies:   Codeine   Social History   Socioeconomic History   Marital status: Single    Spouse name: Not on file   Number of  children: 0   Years of education: Not on file   Highest education level: Not on file  Occupational History   Occupation: retired  Tobacco Use   Smoking status: Never    Passive exposure: Never   Smokeless tobacco: Never  Vaping Use   Vaping status: Never Used  Substance and Sexual Activity   Alcohol use: Yes    Comment: occasional   Drug use: No   Sexual activity: Not on file  Other Topics Concern   Not on file  Social History Narrative   Not on file   Social Drivers of Health   Financial Resource Strain: Low Risk  (11/14/2020)   Received from [facility] visits prior to 08/22/2022.   Overall Financial Resource Strain (CARDIA)    Difficulty of Paying Living Expenses: Not very hard  Food Insecurity: Low Risk  (08/14/2023)   Received from [facility]   Hunger Vital Sign  Within the past 12 months, you worried that your food would run out before you got money to buy more: Never true    Within the past 12 months, the food you bought just didn't last and you didn't have money to get more. : Never true  Transportation Needs: No Transportation Needs (08/14/2023)   Received from [facility]   Transportation    In the past 12 months, has lack of reliable transportation kept you from medical appointments, meetings, work or from getting things needed for daily living? : No  Physical Activity: Insufficiently Active (11/14/2020)   Received from [facility] visits prior to 08/22/2022.   Exercise Vital Sign    On average, how many days per week do you engage in moderate to strenuous exercise (like a brisk walk)?: 4 days    On average, how many minutes do you engage in exercise at this level?: 30 min  Stress: No Stress Concern Present (11/14/2020)   Received from [facility] visits prior to 08/22/2022.   [employer] of Occupational Health - Occupational Stress Questionnaire    Feeling of Stress : Only a little  Social  Connections: Moderately Integrated (11/14/2020)   Received from [facility] visits prior to 08/22/2022.   Social Connection and Isolation Panel    Frequency of Communication with Friends and Family: Not on file    How often do you get together with friends or relatives?: More than three times a week    How often do you attend church or religious services?: More than 4 times per year    Do you belong to any clubs or organizations such as church groups, unions, fraternal or athletic groups, or school groups?: Yes    How often do you attend meetings of the clubs or organizations you belong to?: More than 4 times per year    Are you married, widowed, divorced, separated, never married, or living with a partner?: Never married     Family History: The patient's family history includes Cirrhosis in an other family member; Diabetes in her mother; Heart disease in her father; Lymphoma in her brother. There is no history of Colon cancer.  ROS:   Please see the history of present illness.    All other systems reviewed and are negative.  EKGs/Labs/Other Studies Reviewed:    The following studies were reviewed today: .SABRA   I discussed my findings with the patient at length   Recent Labs: 11/24/2023: ALT 13; BUN 16; Creatinine, Ser 0.99; Hemoglobin 13.4; Platelets 190.0; Potassium 3.5; Sodium 138  Recent Lipid Panel    Component Value Date/Time   CHOL 142 12/24/2021 0810   TRIG 107 12/24/2021 0810   HDL 56 12/24/2021 0810   CHOLHDL 2.5 12/24/2021 0810   LDLCALC 67 12/24/2021 0810    Physical Exam:    VS:  BP (!) 146/84   Pulse 82   Ht 5' 6 (1.676 m)   Wt 185 lb 3.2 oz (84 kg)   SpO2 93%   BMI 29.89 kg/m     Wt Readings from Last 3 Encounters:  01/18/24 185 lb 3.2 oz (84 kg)  11/24/23 188 lb (85.3 kg)  04/20/23 202 lb 6.4 oz (91.8 kg)     GEN: Patient is in no acute distress HEENT: Normal NECK: No JVD; No carotid bruits LYMPHATICS: No  lymphadenopathy CARDIAC: Hear sounds irregular, 2/6 systolic murmur at the apex. RESPIRATORY:  Clear to auscultation without rales,  wheezing or rhonchi  ABDOMEN: Soft, non-tender, non-distended MUSCULOSKELETAL:  No edema; No deformity  SKIN: Warm and dry NEUROLOGIC:  Alert and oriented x 3 PSYCHIATRIC:  Normal affect   Signed, Jennifer JONELLE Crape, MD  01/18/2024 9:47 AM    [facility]

## 2024-01-25 ENCOUNTER — Other Ambulatory Visit: Payer: Self-pay | Admitting: Cardiology

## 2024-02-16 ENCOUNTER — Other Ambulatory Visit: Payer: Self-pay | Admitting: Cardiology

## 2024-02-16 DIAGNOSIS — I480 Paroxysmal atrial fibrillation: Secondary | ICD-10-CM

## 2024-02-16 DIAGNOSIS — I10 Essential (primary) hypertension: Secondary | ICD-10-CM

## 2024-02-16 NOTE — Telephone Encounter (Signed)
 Pt last saw Dr Edwyna 01/18/24, last labs 11/24/23 Creat 0.99, age 83, weight 84kg, based on specified criteria pt is on appropriate dosage of Eliquis  5mg  BID for afib.  Will refill rx.

## 2024-02-28 ENCOUNTER — Encounter (HOSPITAL_BASED_OUTPATIENT_CLINIC_OR_DEPARTMENT_OTHER): Payer: Self-pay

## 2024-02-28 ENCOUNTER — Ambulatory Visit (HOSPITAL_BASED_OUTPATIENT_CLINIC_OR_DEPARTMENT_OTHER)
Admission: EM | Admit: 2024-02-28 | Discharge: 2024-02-28 | Disposition: A | Discharge status: Home / Self Care | Attending: Family Medicine | Admitting: Family Medicine

## 2024-02-28 DIAGNOSIS — H6122 Impacted cerumen, left ear: Secondary | ICD-10-CM

## 2024-02-28 NOTE — Discharge Instructions (Signed)
 We cleaned the ear out. Due to bleeding we needed to stop. I wouldn't put anything in the ear for now until it heals.  Follow up with the ENT as planned.

## 2024-02-28 NOTE — ED Provider Notes (Signed)
 [facility]    CSN: [id number] Arrival date & time: 02/28/24  9070      History   Chief Complaint Chief Complaint  Patient presents with   Ear Fullness    HPI Caroline Dean is a 83 y.o. female.   Pt is an 83 year old female that presents with cerumen impaction. Pt states she went to have hearing aids and they was unable to fit her for hearing aids due to impacted left ear wax. She has been unable to get into a provider to have it cleaned out. She did go to a different UC on Friday and they was unable to wash her ear but did attempt to scrap it out and caused her ear to bleed.     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   Qualifier: Diagnosis of   By: Rosabel Penny Balm, Norchel      Replacing diagnoses that were inactivated after the 09/21/22 regulatory import     Dyslipidemia 01/24/2015   Dysphagia    Essential hypertension 12/14/2019   Fatty liver    Gastro-esophageal reflux disease with esophagitis 10/28/2015   GERD (gastroesophageal reflux disease)    High risk medication use 10/28/2015   Hyperlipemia    Hyperplastic colon polyp    Hypertension    Iron deficiency anemia, unspecified    Malaise and fatigue 10/28/2015   Mixed hyperlipidemia 10/28/2015   Nephrolithiasis 10/28/2015   Obesity (BMI 30.0-34.9) 12/08/2021   On amiodarone  therapy 02/04/2023   Osteoarthritis    PAF (paroxysmal atrial fibrillation) (HCC) 01/18/2020   Palpitations 01/24/2015   Primary osteoarthritis involving multiple joints 10/28/2015   S/P Nissen fundoplication (without gastrostomy tube) procedure 07/14/2019    Shortness of breath 01/24/2015   Tachycardia    Ulcerative colitis (HCC) 08/15/2007   Qualifier: Diagnosis of  By: Rosabel Penny Balm, Norchel     Vitamin B12 deficiency    Vitamin D  deficiency 10/28/2015    Patient Active Problem List   Diagnosis Date Noted   On amiodarone  therapy 02/04/2023   Obesity (BMI 30.0-34.9) 12/08/2021   Calculus of gallbladder without cholecystitis without obstruction 08/28/2020   Hyperlipemia    Hypertension    PAF (paroxysmal atrial fibrillation) (HCC) 01/18/2020   Diverticulosis    Fatty liver    GERD (gastroesophageal reflux disease)    Hyperplastic colon polyp    Osteoarthritis    Tachycardia    Essential hypertension 12/14/2019   Atrial fibrillation (HCC) 12/13/2019   S/P Nissen fundoplication (without gastrostomy tube) procedure 07/14/2019   Dysphagia    Mixed hyperlipidemia 10/28/2015   Age-related osteoporosis without current pathological fracture 10/28/2015   Anxiety 10/28/2015   Gastro-esophageal reflux disease with esophagitis 10/28/2015   High risk medication use 10/28/2015   Malaise and fatigue 10/28/2015   Nephrolithiasis 10/28/2015   Primary osteoarthritis involving multiple joints 10/28/2015   Vitamin D  deficiency 10/28/2015   Dyslipidemia 01/24/2015   Palpitations 01/24/2015   Shortness of breath 01/24/2015   Vitamin B12 deficiency 04/24/2009   IRON DEFICIENCY 08/25/2007   AGITATION 08/15/2007   Ulcerative colitis (HCC) 08/15/2007   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/2007    Past  Surgical History:  Procedure Laterality Date   BIOPSY  06/19/2019   Procedure: BIOPSY;  Surgeon: Shila Gustav GAILS, MD;  Location: [facility];  Service: Endoscopy;;   COLONOSCOPY  01/23/2021   numerous due to UC--2019- Dr Towana, not in epic,  2013, 2010,2007, 2004 and earler   DILATION AND CURETTAGE OF UTERUS     ESOPHAGEAL MANOMETRY N/A 06/19/2019   Procedure: ESOPHAGEAL MANOMETRY (EM);  Surgeon: Shila Gustav GAILS, MD;  Location: [facility];  Service: Endoscopy;  Laterality: N/A;   ESOPHAGOGASTRODUODENOSCOPY (EGD) WITH PROPOFOL  N/A 06/19/2019   Procedure: ESOPHAGOGASTRODUODENOSCOPY (EGD) WITH PROPOFOL ;  Surgeon: Shila Gustav GAILS, MD;  Location: [facility];  Service: Endoscopy;  Laterality: N/A;  with manometry probe placement   HIATAL HERNIA REPAIR     January 2021   TONSILLECTOMY AND ADENOIDECTOMY      OB History   No obstetric history on file.      Home Medications    Prior to Admission medications   Medication Sig Start Date End Date Taking? Authorizing Provider  ALPRAZolam  (XANAX ) 0.25 MG tablet Take 0.25 mg by mouth daily as needed for anxiety.  03/07/12   [provider]  Cholecalciferol 25 MCG (1000 UT) tablet Take 3,000 Units by mouth daily.     [provider]  Cyanocobalamin  1000 MCG/ML KIT Inject 1,000 mcg as directed every 30 (thirty) days.    [provider]  diltiazem  (CARDIZEM  CD) 240 MG 24 hr capsule TAKE 1 CAPSULE(240 MG) BY MOUTH DAILY 01/26/24   Revankar, Jennifer SAUNDERS, MD  ELIQUIS  5 MG TABS tablet TAKE 1 TABLET(5 MG) BY MOUTH TWICE DAILY 02/16/24   Revankar, Rajan R, MD  ferrous sulfate 325 (65 FE) MG tablet Take 325 mg by mouth 3 (three) times a week.     [provider]  folic acid  (FOLVITE ) 1 MG tablet TAKE 1 TABLET BY MOUTH EVERY DAY 12/21/23   Nandigam, Kavitha V, MD  furosemide (LASIX) 20 MG tablet Take 20 mg by mouth as needed for fluid or edema. 02/12/23   [provider]  losartan  (COZAAR ) 50 MG tablet Take 1 tablet (50 mg total) by mouth daily. 02/16/24   Revankar, Jennifer SAUNDERS, MD  metoprolol  succinate (TOPROL -XL) 50 MG 24 hr tablet Take 1 tablet (50 mg total) by mouth daily. 10/28/23   Revankar, Jennifer SAUNDERS, MD  omeprazole  (PRILOSEC) 20 MG capsule Take 20 mg by mouth daily.    [provider]  Probiotic Product (ALIGN) 4 MG CAPS Take 1 capsule by mouth daily.    [provider]  simvastatin (ZOCOR) 20 MG tablet Take 20 mg by mouth daily.   04/22/11   [provider]  sulfaSALAzine  (AZULFIDINE ) 500 MG tablet Take 2 tablets (1,000 mg total) by mouth 2 (two) times daily. 11/24/23   Beather Delon Gibson, PA    Family History Family History  Problem Relation Age of Onset   Lymphoma Brother    Diabetes Mother    Heart disease Father    Cirrhosis Other        neice with liver transplant   Colon cancer Neg Hx     Social History Social History   Tobacco Use   Smoking status: Never    Passive exposure: Never   Smokeless tobacco: Never  Vaping Use   Vaping status: Never Used  Substance Use Topics   Alcohol use: Yes    Comment: occasional   Drug use: No     Allergies   Codeine  Review of Systems Review of Systems See HPI  Physical Exam Triage Vital Signs ED Triage Vitals  Encounter Vitals Group     BP 02/28/24 1008 (!) 146/83     Girls Systolic BP Percentile --      Girls Diastolic BP Percentile --      Boys Systolic BP Percentile --      Boys Diastolic BP Percentile --      Pulse Rate 02/28/24 1008 85     Resp 02/28/24 1008 20     Temp 02/28/24 1008 97.6 F (36.4 C)     Temp Source 02/28/24 1008 Oral     SpO2 02/28/24 1008 95 %     Weight --      Height --      Head Circumference --      Peak Flow --      Pain Score 02/28/24 1005 0     Pain Loc --      Pain Education --      Exclude from Growth Chart --    No data found.  Updated Vital Signs BP (!) 146/83 (BP Location: Right Arm)   Pulse 85   Temp 97.6 F (36.4 C) (Oral)   Resp 20   SpO2 95%   Visual Acuity Right Eye Distance:   Left Eye Distance:   Bilateral Distance:    Right Eye Near:   Left Eye Near:    Bilateral Near:     Physical Exam Vitals and nursing note reviewed.  Constitutional:      General: She is not in acute distress.    Appearance: Normal appearance. She is not ill-appearing, toxic-appearing or diaphoretic.  HENT:     Left Ear: There is impacted cerumen.     Ears:     Comments: Dried blood in ear  canal  Pulmonary:     Effort: Pulmonary effort is normal.  Neurological:     Mental Status: She is alert.  Psychiatric:        Mood and Affect: Mood normal.      UC Treatments / Results  Labs (all labs ordered are listed, but only abnormal results are displayed) Labs Reviewed - No data to display  EKG   Radiology No results found.  Procedures Procedures (including critical care time)  Medications Ordered in UC Medications - No data to display  Initial Impression / Assessment and Plan / UC Course  I have reviewed the triage vital signs and the nursing notes.  Pertinent labs & imaging results that were available during my care of the patient were reviewed by me and considered in my medical decision making (see chart for details).     Cerumen impaction- ear wash done here and able to get some out but she started bleeding. Needs to let heal before any more lavage to prevent damage to tissue. Recommend continue the ear drops and return next week to attempt removal.  Otherwise see ENT as planned  Final Clinical Impressions(s) / UC Diagnoses   Final diagnoses:  Impacted cerumen of left ear     Discharge Instructions      We cleaned the ear out. Due to bleeding we needed to stop. I wouldn't put anything in the ear for now until it heals.  Follow up with the ENT as planned.     ED Prescriptions   None    PDMP not reviewed this encounter.   Adah Wilbert LABOR, FNP 02/28/24 1423

## 2024-02-28 NOTE — ED Triage Notes (Signed)
 Pt states she went to have hearing aids and they was unable to fit her for hearing aids due to impacted left ear wax. She has been unable to get into a provider to have it cleaned out. She did go to a different UC on Friday and they was unable to wash her ear but did attempt to scrap it out and caused her ear to bleed.

## 2024-03-06 ENCOUNTER — Ambulatory Visit (HOSPITAL_BASED_OUTPATIENT_CLINIC_OR_DEPARTMENT_OTHER)

## 2024-03-06 ENCOUNTER — Ambulatory Visit (HOSPITAL_BASED_OUTPATIENT_CLINIC_OR_DEPARTMENT_OTHER): Admission: EM | Admit: 2024-03-06 | Discharge: 2024-03-06 | Disposition: A | Discharge status: Home / Self Care

## 2024-03-06 ENCOUNTER — Encounter (HOSPITAL_BASED_OUTPATIENT_CLINIC_OR_DEPARTMENT_OTHER): Payer: Self-pay | Admitting: Emergency Medicine

## 2024-03-06 DIAGNOSIS — H9193 Unspecified hearing loss, bilateral: Secondary | ICD-10-CM | POA: Diagnosis not present

## 2024-03-06 DIAGNOSIS — S00412D Abrasion of left ear, subsequent encounter: Secondary | ICD-10-CM | POA: Diagnosis not present

## 2024-03-06 NOTE — Discharge Instructions (Signed)
 Abrasion of left ear canal after ear lavage and bilateral hearing loss: Bleeding has stopped.  No sign of infection or irritation of left ear canal.  Both canals are clear of wax and the eardrums are normal in appearance.  May proceed with workup for hearing aids.  Follow-up here as needed.  May see small amounts of blood as the ears are rinsed over time, as the small amount of dried blood clears.

## 2024-03-06 NOTE — ED Provider Notes (Signed)
 [facility]    CSN: [id number] Arrival date & time: 03/06/24  0845      History   Chief Complaint No chief complaint on file.   HPI Caroline Dean is a 83 y.o. female.   The patient is hoping to get hearing aids and came in on 02/28/2024 for ear lavage.  She had a little bit of bleeding in the left ear canal after lavage.  She wanted to have her ears rechecked before she moved forward with getting the hearing aids.  Her ears feel good, no pain and they do not feel full anymore.     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   Qualifier: Diagnosis of   By: Rosabel Penny Balm, Norchel      Replacing diagnoses that were inactivated after the 09/21/22 regulatory import     Dyslipidemia 01/24/2015   Dysphagia    Essential hypertension 12/14/2019   Fatty liver    Gastro-esophageal reflux disease with esophagitis 10/28/2015   GERD (gastroesophageal reflux disease)    High risk medication use 10/28/2015   Hyperlipemia    Hyperplastic colon polyp    Hypertension    Iron deficiency anemia, unspecified    Malaise and fatigue 10/28/2015   Mixed hyperlipidemia 10/28/2015   Nephrolithiasis 10/28/2015   Obesity (BMI 30.0-34.9) 12/08/2021   On amiodarone  therapy 02/04/2023   Osteoarthritis    PAF (paroxysmal atrial fibrillation) (HCC) 01/18/2020   Palpitations 01/24/2015   Primary osteoarthritis involving multiple joints 10/28/2015   S/P Nissen fundoplication (without gastrostomy tube) procedure 07/14/2019   Shortness of breath 01/24/2015   Tachycardia    Ulcerative colitis (HCC) 08/15/2007   Qualifier: Diagnosis of  By:  Rosabel Penny Balm, [place]     Vitamin B12 deficiency    Vitamin D  deficiency 10/28/2015    Patient Active Problem List   Diagnosis Date Noted   On amiodarone  therapy 02/04/2023   Obesity (BMI 30.0-34.9) 12/08/2021   Calculus of gallbladder without cholecystitis without obstruction 08/28/2020   Hyperlipemia    Hypertension    PAF (paroxysmal atrial fibrillation) (HCC) 01/18/2020   Diverticulosis    Fatty liver    GERD (gastroesophageal reflux disease)    Hyperplastic colon polyp    Osteoarthritis    Tachycardia    Essential hypertension 12/14/2019   Atrial fibrillation (HCC) 12/13/2019   S/P Nissen fundoplication (without gastrostomy tube) procedure 07/14/2019   Dysphagia    Mixed hyperlipidemia 10/28/2015   Age-related osteoporosis without current pathological fracture 10/28/2015   Anxiety 10/28/2015   Gastro-esophageal reflux disease with esophagitis 10/28/2015   High risk medication use 10/28/2015   Malaise and fatigue 10/28/2015   Nephrolithiasis 10/28/2015   Primary osteoarthritis involving multiple joints 10/28/2015   Vitamin D  deficiency 10/28/2015   Dyslipidemia 01/24/2015   Palpitations 01/24/2015   Shortness of breath 01/24/2015   Vitamin B12 deficiency 04/24/2009   IRON DEFICIENCY 08/25/2007   AGITATION 08/15/2007   Ulcerative colitis (HCC) 08/15/2007   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/2007    Past Surgical History:  Procedure Laterality Date   BIOPSY  06/19/2019   Procedure: BIOPSY;  Surgeon: Shila Gustav GAILS, MD;  Location: [facility];  Service: Endoscopy;;  COLONOSCOPY  01/23/2021   numerous due to UC--2019- Dr Towana, not in epic,  2013, 2010,2007, 2004 and earler   DILATION AND CURETTAGE OF UTERUS     ESOPHAGEAL MANOMETRY N/A 06/19/2019   Procedure: ESOPHAGEAL MANOMETRY (EM);  Surgeon: Shila Gustav GAILS, MD;  Location: [facility];  Service: Endoscopy;  Laterality: N/A;   ESOPHAGOGASTRODUODENOSCOPY (EGD) WITH PROPOFOL  N/A 06/19/2019    Procedure: ESOPHAGOGASTRODUODENOSCOPY (EGD) WITH PROPOFOL ;  Surgeon: Shila Gustav GAILS, MD;  Location: [facility];  Service: Endoscopy;  Laterality: N/A;  with manometry probe placement   HIATAL HERNIA REPAIR     January 2021   TONSILLECTOMY AND ADENOIDECTOMY      OB History   No obstetric history on file.      Home Medications    Prior to Admission medications   Medication Sig Start Date End Date Taking? Authorizing Provider  ALPRAZolam  (XANAX ) 0.25 MG tablet Take 0.25 mg by mouth daily as needed for anxiety.  03/07/12   [provider]  Cholecalciferol 25 MCG (1000 UT) tablet Take 3,000 Units by mouth daily.     [provider]  Cyanocobalamin  1000 MCG/ML KIT Inject 1,000 mcg as directed every 30 (thirty) days.    [provider]  diltiazem  (CARDIZEM  CD) 240 MG 24 hr capsule TAKE 1 CAPSULE(240 MG) BY MOUTH DAILY 01/26/24   Revankar, Jennifer SAUNDERS, MD  ELIQUIS  5 MG TABS tablet TAKE 1 TABLET(5 MG) BY MOUTH TWICE DAILY 02/16/24   Revankar, Rajan R, MD  ferrous sulfate 325 (65 FE) MG tablet Take 325 mg by mouth 3 (three) times a week.     [provider]  folic acid  (FOLVITE ) 1 MG tablet TAKE 1 TABLET BY MOUTH EVERY DAY 12/21/23   Nandigam, Kavitha V, MD  furosemide (LASIX) 20 MG tablet Take 20 mg by mouth as needed for fluid or edema. 02/12/23   [provider]  losartan  (COZAAR ) 50 MG tablet Take 1 tablet (50 mg total) by mouth daily. 02/16/24   Revankar, Jennifer SAUNDERS, MD  metoprolol  succinate (TOPROL -XL) 50 MG 24 hr tablet Take 1 tablet (50 mg total) by mouth daily. 10/28/23   Revankar, Jennifer SAUNDERS, MD  omeprazole  (PRILOSEC) 20 MG capsule Take 20 mg by mouth daily.    [provider]  Probiotic Product (ALIGN) 4 MG CAPS Take 1 capsule by mouth daily.    [provider]  simvastatin (ZOCOR) 20 MG tablet Take 20 mg by mouth daily.  04/22/11   [provider]  sulfaSALAzine  (AZULFIDINE ) 500 MG tablet Take 2 tablets (1,000 mg total) by  mouth 2 (two) times daily. 11/24/23   Beather Delon Gibson, PA    Family History Family History  Problem Relation Age of Onset   Lymphoma Brother    Diabetes Mother    Heart disease Father    Cirrhosis Other        neice with liver transplant   Colon cancer Neg Hx     Social History Social History   Tobacco Use   Smoking status: Never    Passive exposure: Never   Smokeless tobacco: Never  Vaping Use   Vaping status: Never Used  Substance Use Topics   Alcohol use: Yes    Comment: occasional   Drug use: No     Allergies   Codeine   Review of Systems Review of Systems  Constitutional:  Negative for fever.  HENT:  Negative for ear discharge and ear pain.   Respiratory:  Negative for cough.  Cardiovascular:  Negative for chest pain.  Gastrointestinal:  Negative for abdominal pain, constipation, diarrhea, nausea and vomiting.  Musculoskeletal:  Negative for arthralgias and back pain.  Skin:  Negative for color change and rash.  Neurological:  Negative for syncope.  All other systems reviewed and are negative.    Physical Exam Triage Vital Signs ED Triage Vitals  Encounter Vitals Group     BP 03/06/24 0903 (!) 164/104     Girls Systolic BP Percentile --      Girls Diastolic BP Percentile --      Boys Systolic BP Percentile --      Boys Diastolic BP Percentile --      Pulse Rate 03/06/24 0903 78     Resp 03/06/24 0903 20     Temp 03/06/24 0903 97.8 F (36.6 C)     Temp Source 03/06/24 0903 Oral     SpO2 03/06/24 0903 94 %     Weight --      Height --      Head Circumference --      Peak Flow --      Pain Score 03/06/24 0902 0     Pain Loc --      Pain Education --      Exclude from Growth Chart --    No data found.  Updated Vital Signs BP (!) 164/104 (BP Location: Right Arm)   Pulse 78   Temp 97.8 F (36.6 C) (Oral)   Resp 20   SpO2 94%   Visual Acuity Right Eye Distance:   Left Eye Distance:   Bilateral Distance:    Right Eye Near:    Left Eye Near:    Bilateral Near:     Physical Exam Vitals and nursing note reviewed.  Constitutional:      General: She is not in acute distress.    Appearance: She is well-developed. She is not ill-appearing or toxic-appearing.  HENT:     Head: Normocephalic and atraumatic.     Right Ear: Tympanic membrane, ear canal and external ear normal. Decreased hearing noted.     Left Ear: Tympanic membrane, ear canal and external ear normal. Decreased hearing noted. Drainage (Minimal amount of dried blood in the left ear canal from previous abrasion.  No active bleeding, no discharge, no swelling, no pain) present.     Nose: Nose normal.     Mouth/Throat:     Lips: Pink.     Mouth: Mucous membranes are moist.  Eyes:     Conjunctiva/sclera: Conjunctivae normal.     Pupils: Pupils are equal, round, and reactive to light.  Cardiovascular:     Rate and Rhythm: Normal rate and regular rhythm.     Heart sounds: S1 normal and S2 normal. No murmur heard. Pulmonary:     Effort: Pulmonary effort is normal. No respiratory distress.     Breath sounds: Normal breath sounds. No decreased breath sounds, wheezing, rhonchi or rales.  Musculoskeletal:        General: No swelling.  Skin:    General: Skin is warm and dry.     Capillary Refill: Capillary refill takes less than 2 seconds.     Findings: No rash.  Neurological:     Mental Status: She is alert and oriented to person, place, and time.  Psychiatric:        Mood and Affect: Mood normal.      UC Treatments / Results  Labs (all labs ordered are listed, but  only abnormal results are displayed) Labs Reviewed - No data to display  EKG   Radiology No results found.  Procedures Procedures (including critical care time)  Medications Ordered in UC Medications - No data to display  Initial Impression / Assessment and Plan / UC Course  I have reviewed the triage vital signs and the nursing notes.  Pertinent labs & imaging results that  were available during my care of the patient were reviewed by me and considered in my medical decision making (see chart for details).  Plan of Care:  Abrasion of left ear and decreased hearing bilaterally: Abrasion has healed.  There is a little bit of dried blood in the left ear canal.  Avoid use of any Q-tips.  May proceed with hearing test and workup for hearing aids.  Follow-up here as needed.  I reviewed the plan of care with the patient and/or the patient's guardian.  The patient and/or guardian had time to ask questions and acknowledged that the questions were answered.  I provided instruction on symptoms or reasons to return here or to go to an ER, if symptoms/condition did not improve, worsened or if new symptoms occurred.  Final Clinical Impressions(s) / UC Diagnoses   Final diagnoses:  Abrasion of left ear canal, subsequent encounter  Decreased hearing of both ears     Discharge Instructions      Abrasion of left ear canal after ear lavage and bilateral hearing loss: Bleeding has stopped.  No sign of infection or irritation of left ear canal.  Both canals are clear of wax and the eardrums are normal in appearance.  May proceed with workup for hearing aids.  Follow-up here as needed.  May see small amounts of blood as the ears are rinsed over time, as the small amount of dried blood clears.     ED Prescriptions   None    PDMP not reviewed this encounter.   Ival Domino, FNP 03/06/24 [phone]

## 2024-03-06 NOTE — ED Triage Notes (Signed)
 Pt reports she is here for a follow up today to make sure her left ear has healed since her last visit on 09/08. Pt reports her ear feels better and she denies any pain.

## 2024-04-25 ENCOUNTER — Other Ambulatory Visit: Payer: Self-pay | Admitting: Gastroenterology

## 2024-07-22 ENCOUNTER — Other Ambulatory Visit: Payer: Self-pay | Admitting: Cardiology
